# Patient Record
Sex: Male | Born: 1935 | Race: White | Hispanic: No | Marital: Married | Smoking: Former smoker
Health system: Southern US, Community
[De-identification: ages and names within clinical notes are randomized; demographics above are authoritative.]

## PROBLEM LIST (undated history)

## (undated) DIAGNOSIS — E7800 Pure hypercholesterolemia, unspecified: Secondary | ICD-10-CM

## (undated) DIAGNOSIS — M1990 Unspecified osteoarthritis, unspecified site: Secondary | ICD-10-CM

## (undated) DIAGNOSIS — I10 Essential (primary) hypertension: Secondary | ICD-10-CM

## (undated) DIAGNOSIS — H9190 Unspecified hearing loss, unspecified ear: Secondary | ICD-10-CM

## (undated) HISTORY — PX: TONSILLECTOMY: SUR1361

## (undated) HISTORY — PX: HERNIA REPAIR: SHX51

---

## 2013-05-07 HISTORY — PX: TRANSURETHRAL RESECTION OF PROSTATE: SHX73

## 2015-03-24 DIAGNOSIS — R413 Other amnesia: Secondary | ICD-10-CM | POA: Diagnosis not present

## 2015-03-24 DIAGNOSIS — N401 Enlarged prostate with lower urinary tract symptoms: Secondary | ICD-10-CM | POA: Diagnosis not present

## 2015-03-24 DIAGNOSIS — I10 Essential (primary) hypertension: Secondary | ICD-10-CM | POA: Diagnosis not present

## 2015-03-24 DIAGNOSIS — R202 Paresthesia of skin: Secondary | ICD-10-CM | POA: Diagnosis not present

## 2015-03-24 DIAGNOSIS — E7800 Pure hypercholesterolemia, unspecified: Secondary | ICD-10-CM | POA: Diagnosis not present

## 2015-03-29 DIAGNOSIS — N401 Enlarged prostate with lower urinary tract symptoms: Secondary | ICD-10-CM | POA: Diagnosis not present

## 2015-03-29 DIAGNOSIS — R3912 Poor urinary stream: Secondary | ICD-10-CM | POA: Diagnosis not present

## 2015-03-29 DIAGNOSIS — N393 Stress incontinence (female) (male): Secondary | ICD-10-CM | POA: Diagnosis not present

## 2015-06-10 DIAGNOSIS — X32XXXA Exposure to sunlight, initial encounter: Secondary | ICD-10-CM | POA: Diagnosis not present

## 2015-06-10 DIAGNOSIS — D1801 Hemangioma of skin and subcutaneous tissue: Secondary | ICD-10-CM | POA: Diagnosis not present

## 2015-06-10 DIAGNOSIS — L570 Actinic keratosis: Secondary | ICD-10-CM | POA: Diagnosis not present

## 2015-06-10 DIAGNOSIS — D235 Other benign neoplasm of skin of trunk: Secondary | ICD-10-CM | POA: Diagnosis not present

## 2015-07-06 DIAGNOSIS — M545 Low back pain: Secondary | ICD-10-CM | POA: Diagnosis not present

## 2015-09-21 DIAGNOSIS — R413 Other amnesia: Secondary | ICD-10-CM | POA: Diagnosis not present

## 2015-09-21 DIAGNOSIS — E7800 Pure hypercholesterolemia, unspecified: Secondary | ICD-10-CM | POA: Diagnosis not present

## 2015-09-21 DIAGNOSIS — R202 Paresthesia of skin: Secondary | ICD-10-CM | POA: Diagnosis not present

## 2015-09-21 DIAGNOSIS — I10 Essential (primary) hypertension: Secondary | ICD-10-CM | POA: Diagnosis not present

## 2015-09-21 DIAGNOSIS — N401 Enlarged prostate with lower urinary tract symptoms: Secondary | ICD-10-CM | POA: Diagnosis not present

## 2015-12-06 DIAGNOSIS — H2513 Age-related nuclear cataract, bilateral: Secondary | ICD-10-CM | POA: Diagnosis not present

## 2015-12-09 DIAGNOSIS — D1801 Hemangioma of skin and subcutaneous tissue: Secondary | ICD-10-CM | POA: Diagnosis not present

## 2015-12-09 DIAGNOSIS — X32XXXA Exposure to sunlight, initial encounter: Secondary | ICD-10-CM | POA: Diagnosis not present

## 2015-12-09 DIAGNOSIS — L570 Actinic keratosis: Secondary | ICD-10-CM | POA: Diagnosis not present

## 2016-04-12 DIAGNOSIS — N401 Enlarged prostate with lower urinary tract symptoms: Secondary | ICD-10-CM | POA: Diagnosis not present

## 2016-04-12 DIAGNOSIS — R3912 Poor urinary stream: Secondary | ICD-10-CM | POA: Diagnosis not present

## 2016-04-12 DIAGNOSIS — N393 Stress incontinence (female) (male): Secondary | ICD-10-CM | POA: Diagnosis not present

## 2016-05-02 DIAGNOSIS — E7800 Pure hypercholesterolemia, unspecified: Secondary | ICD-10-CM | POA: Diagnosis not present

## 2016-05-02 DIAGNOSIS — N401 Enlarged prostate with lower urinary tract symptoms: Secondary | ICD-10-CM | POA: Diagnosis not present

## 2016-05-02 DIAGNOSIS — I10 Essential (primary) hypertension: Secondary | ICD-10-CM | POA: Diagnosis not present

## 2016-05-02 DIAGNOSIS — M129 Arthropathy, unspecified: Secondary | ICD-10-CM | POA: Diagnosis not present

## 2016-11-01 DIAGNOSIS — N401 Enlarged prostate with lower urinary tract symptoms: Secondary | ICD-10-CM | POA: Diagnosis not present

## 2016-11-01 DIAGNOSIS — M129 Arthropathy, unspecified: Secondary | ICD-10-CM | POA: Diagnosis not present

## 2016-11-01 DIAGNOSIS — E7800 Pure hypercholesterolemia, unspecified: Secondary | ICD-10-CM | POA: Diagnosis not present

## 2016-11-01 DIAGNOSIS — I10 Essential (primary) hypertension: Secondary | ICD-10-CM | POA: Diagnosis not present

## 2016-11-20 DIAGNOSIS — E669 Obesity, unspecified: Secondary | ICD-10-CM | POA: Diagnosis not present

## 2016-11-20 DIAGNOSIS — M5440 Lumbago with sciatica, unspecified side: Secondary | ICD-10-CM | POA: Diagnosis not present

## 2016-12-07 DIAGNOSIS — L821 Other seborrheic keratosis: Secondary | ICD-10-CM | POA: Diagnosis not present

## 2016-12-07 DIAGNOSIS — L218 Other seborrheic dermatitis: Secondary | ICD-10-CM | POA: Diagnosis not present

## 2016-12-07 DIAGNOSIS — X32XXXA Exposure to sunlight, initial encounter: Secondary | ICD-10-CM | POA: Diagnosis not present

## 2016-12-07 DIAGNOSIS — L570 Actinic keratosis: Secondary | ICD-10-CM | POA: Diagnosis not present

## 2017-04-18 DIAGNOSIS — N401 Enlarged prostate with lower urinary tract symptoms: Secondary | ICD-10-CM | POA: Diagnosis not present

## 2017-04-18 DIAGNOSIS — N393 Stress incontinence (female) (male): Secondary | ICD-10-CM | POA: Diagnosis not present

## 2017-04-18 DIAGNOSIS — R3912 Poor urinary stream: Secondary | ICD-10-CM | POA: Diagnosis not present

## 2017-05-07 DIAGNOSIS — Z23 Encounter for immunization: Secondary | ICD-10-CM | POA: Diagnosis not present

## 2017-05-07 DIAGNOSIS — N401 Enlarged prostate with lower urinary tract symptoms: Secondary | ICD-10-CM | POA: Diagnosis not present

## 2017-05-07 DIAGNOSIS — R413 Other amnesia: Secondary | ICD-10-CM | POA: Diagnosis not present

## 2017-05-07 DIAGNOSIS — M129 Arthropathy, unspecified: Secondary | ICD-10-CM | POA: Diagnosis not present

## 2017-05-07 DIAGNOSIS — I10 Essential (primary) hypertension: Secondary | ICD-10-CM | POA: Diagnosis not present

## 2017-05-07 DIAGNOSIS — E7800 Pure hypercholesterolemia, unspecified: Secondary | ICD-10-CM | POA: Diagnosis not present

## 2017-05-07 DIAGNOSIS — Z0000 Encounter for general adult medical examination without abnormal findings: Secondary | ICD-10-CM | POA: Diagnosis not present

## 2017-11-20 DIAGNOSIS — E7800 Pure hypercholesterolemia, unspecified: Secondary | ICD-10-CM | POA: Diagnosis not present

## 2017-11-20 DIAGNOSIS — N401 Enlarged prostate with lower urinary tract symptoms: Secondary | ICD-10-CM | POA: Diagnosis not present

## 2017-11-20 DIAGNOSIS — I10 Essential (primary) hypertension: Secondary | ICD-10-CM | POA: Diagnosis not present

## 2017-11-20 DIAGNOSIS — M129 Arthropathy, unspecified: Secondary | ICD-10-CM | POA: Diagnosis not present

## 2017-11-20 DIAGNOSIS — R413 Other amnesia: Secondary | ICD-10-CM | POA: Diagnosis not present

## 2017-11-22 DIAGNOSIS — E7800 Pure hypercholesterolemia, unspecified: Secondary | ICD-10-CM | POA: Diagnosis not present

## 2018-01-01 DIAGNOSIS — R413 Other amnesia: Secondary | ICD-10-CM | POA: Diagnosis not present

## 2018-02-10 DIAGNOSIS — H35372 Puckering of macula, left eye: Secondary | ICD-10-CM | POA: Diagnosis not present

## 2018-03-24 DIAGNOSIS — H353132 Nonexudative age-related macular degeneration, bilateral, intermediate dry stage: Secondary | ICD-10-CM | POA: Diagnosis not present

## 2018-03-24 DIAGNOSIS — H348122 Central retinal vein occlusion, left eye, stable: Secondary | ICD-10-CM | POA: Diagnosis not present

## 2018-04-10 DIAGNOSIS — F320 Major depressive disorder, single episode, mild: Secondary | ICD-10-CM | POA: Diagnosis not present

## 2018-04-22 DIAGNOSIS — N393 Stress incontinence (female) (male): Secondary | ICD-10-CM | POA: Diagnosis not present

## 2018-04-22 DIAGNOSIS — N401 Enlarged prostate with lower urinary tract symptoms: Secondary | ICD-10-CM | POA: Diagnosis not present

## 2018-04-22 DIAGNOSIS — R3912 Poor urinary stream: Secondary | ICD-10-CM | POA: Diagnosis not present

## 2018-05-15 DIAGNOSIS — H2513 Age-related nuclear cataract, bilateral: Secondary | ICD-10-CM | POA: Diagnosis not present

## 2018-05-15 DIAGNOSIS — H353132 Nonexudative age-related macular degeneration, bilateral, intermediate dry stage: Secondary | ICD-10-CM | POA: Diagnosis not present

## 2018-06-05 DIAGNOSIS — I10 Essential (primary) hypertension: Secondary | ICD-10-CM | POA: Diagnosis not present

## 2018-06-05 DIAGNOSIS — R5383 Other fatigue: Secondary | ICD-10-CM | POA: Diagnosis not present

## 2018-06-05 DIAGNOSIS — R42 Dizziness and giddiness: Secondary | ICD-10-CM | POA: Diagnosis not present

## 2018-06-17 ENCOUNTER — Ambulatory Visit: Admission: RE | Admit: 2018-06-17 | Payer: PPO | Source: Home / Self Care | Admitting: Ophthalmology

## 2018-06-17 ENCOUNTER — Encounter: Admission: RE | Payer: Self-pay | Source: Home / Self Care

## 2018-06-17 SURGERY — PHACOEMULSIFICATION, CATARACT, WITH IOL INSERTION
Anesthesia: Topical | Laterality: Right

## 2018-07-29 ENCOUNTER — Other Ambulatory Visit: Payer: Self-pay

## 2018-07-29 ENCOUNTER — Encounter: Payer: Self-pay | Admitting: *Deleted

## 2018-07-31 DIAGNOSIS — E1159 Type 2 diabetes mellitus with other circulatory complications: Secondary | ICD-10-CM | POA: Diagnosis not present

## 2018-07-31 DIAGNOSIS — H2511 Age-related nuclear cataract, right eye: Secondary | ICD-10-CM | POA: Diagnosis not present

## 2018-07-31 NOTE — Discharge Instructions (Signed)

## 2018-08-01 ENCOUNTER — Other Ambulatory Visit: Payer: Self-pay

## 2018-08-01 ENCOUNTER — Encounter
Admission: RE | Admit: 2018-08-01 | Discharge: 2018-08-01 | Disposition: A | Discharge status: Home / Self Care | Payer: PPO | Source: Ambulatory Visit | Attending: Ophthalmology | Admitting: Ophthalmology

## 2018-08-01 DIAGNOSIS — Z1159 Encounter for screening for other viral diseases: Secondary | ICD-10-CM | POA: Diagnosis not present

## 2018-08-01 DIAGNOSIS — Z01812 Encounter for preprocedural laboratory examination: Secondary | ICD-10-CM | POA: Insufficient documentation

## 2018-08-02 LAB — NOVEL CORONAVIRUS, NAA (HOSP ORDER, SEND-OUT TO REF LAB; TAT 18-24 HRS): SARS-CoV-2, NAA: NOT DETECTED

## 2018-08-06 ENCOUNTER — Encounter
Admission: RE | Disposition: A | Discharge status: Home / Self Care | Payer: Self-pay | Source: Home / Self Care | Attending: Ophthalmology

## 2018-08-06 ENCOUNTER — Ambulatory Visit: Payer: PPO | Admitting: Anesthesiology

## 2018-08-06 ENCOUNTER — Ambulatory Visit
Admission: RE | Admit: 2018-08-06 | Discharge: 2018-08-06 | Disposition: A | Discharge status: Home / Self Care | Payer: PPO | Attending: Ophthalmology | Admitting: Ophthalmology

## 2018-08-06 ENCOUNTER — Other Ambulatory Visit: Payer: Self-pay

## 2018-08-06 DIAGNOSIS — E7800 Pure hypercholesterolemia, unspecified: Secondary | ICD-10-CM | POA: Insufficient documentation

## 2018-08-06 DIAGNOSIS — H2511 Age-related nuclear cataract, right eye: Secondary | ICD-10-CM | POA: Insufficient documentation

## 2018-08-06 DIAGNOSIS — H9190 Unspecified hearing loss, unspecified ear: Secondary | ICD-10-CM | POA: Diagnosis not present

## 2018-08-06 DIAGNOSIS — Z85828 Personal history of other malignant neoplasm of skin: Secondary | ICD-10-CM | POA: Insufficient documentation

## 2018-08-06 DIAGNOSIS — Z79899 Other long term (current) drug therapy: Secondary | ICD-10-CM | POA: Insufficient documentation

## 2018-08-06 DIAGNOSIS — M1990 Unspecified osteoarthritis, unspecified site: Secondary | ICD-10-CM | POA: Insufficient documentation

## 2018-08-06 DIAGNOSIS — I10 Essential (primary) hypertension: Secondary | ICD-10-CM | POA: Diagnosis not present

## 2018-08-06 DIAGNOSIS — F329 Major depressive disorder, single episode, unspecified: Secondary | ICD-10-CM | POA: Insufficient documentation

## 2018-08-06 DIAGNOSIS — Z882 Allergy status to sulfonamides status: Secondary | ICD-10-CM | POA: Diagnosis not present

## 2018-08-06 DIAGNOSIS — R609 Edema, unspecified: Secondary | ICD-10-CM | POA: Insufficient documentation

## 2018-08-06 DIAGNOSIS — H25811 Combined forms of age-related cataract, right eye: Secondary | ICD-10-CM | POA: Diagnosis not present

## 2018-08-06 HISTORY — DX: Essential (primary) hypertension: I10

## 2018-08-06 HISTORY — PX: CATARACT EXTRACTION W/PHACO: SHX586

## 2018-08-06 HISTORY — DX: Pure hypercholesterolemia, unspecified: E78.00

## 2018-08-06 HISTORY — DX: Unspecified hearing loss, unspecified ear: H91.90

## 2018-08-06 HISTORY — DX: Unspecified osteoarthritis, unspecified site: M19.90

## 2018-08-06 SURGERY — PHACOEMULSIFICATION, CATARACT, WITH IOL INSERTION
Anesthesia: Monitor Anesthesia Care | Site: Eye | Laterality: Right

## 2018-08-06 MED ORDER — NA HYALUR & NA CHOND-NA HYALUR 0.4-0.35 ML IO KIT
PACK | INTRAOCULAR | Status: DC | PRN
  Administered 2018-08-06: 1 mL via INTRAOCULAR

## 2018-08-06 MED ORDER — ARMC OPHTHALMIC DILATING DROPS
1.0000 "application " | OPHTHALMIC | Status: DC | PRN
  Administered 2018-08-06 (×3): 1 via OPHTHALMIC

## 2018-08-06 MED ORDER — MIDAZOLAM HCL 2 MG/2ML IJ SOLN
INTRAMUSCULAR | Status: DC | PRN
  Administered 2018-08-06: 1.5 mg via INTRAVENOUS

## 2018-08-06 MED ORDER — TETRACAINE HCL 0.5 % OP SOLN
1.0000 [drp] | OPHTHALMIC | Status: DC | PRN
  Administered 2018-08-06 (×3): 1 [drp] via OPHTHALMIC

## 2018-08-06 MED ORDER — FENTANYL CITRATE (PF) 100 MCG/2ML IJ SOLN
INTRAMUSCULAR | Status: DC | PRN
  Administered 2018-08-06: 50 ug via INTRAVENOUS

## 2018-08-06 MED ORDER — OXYCODONE HCL 5 MG/5ML PO SOLN: 5.0000 mg | Freq: Once | ORAL | Status: DC | PRN

## 2018-08-06 MED ORDER — CEFUROXIME OPHTHALMIC INJECTION 1 MG/0.1 ML
INJECTION | OPHTHALMIC | Status: DC | PRN
  Administered 2018-08-06: 0.1 mL via INTRACAMERAL

## 2018-08-06 MED ORDER — OXYCODONE HCL 5 MG PO TABS: 5.0000 mg | ORAL_TABLET | Freq: Once | ORAL | Status: DC | PRN

## 2018-08-06 MED ORDER — MOXIFLOXACIN HCL 0.5 % OP SOLN
1.0000 [drp] | OPHTHALMIC | Status: DC | PRN
  Administered 2018-08-06 (×3): 1 [drp] via OPHTHALMIC

## 2018-08-06 MED ORDER — LACTATED RINGERS IV SOLN: INTRAVENOUS | Status: DC

## 2018-08-06 MED ORDER — BRIMONIDINE TARTRATE-TIMOLOL 0.2-0.5 % OP SOLN
OPHTHALMIC | Status: DC | PRN
  Administered 2018-08-06: 1 [drp] via OPHTHALMIC

## 2018-08-06 MED ORDER — EPINEPHRINE PF 1 MG/ML IJ SOLN
INTRAOCULAR | Status: DC | PRN
  Administered 2018-08-06: 78 mL via OPHTHALMIC

## 2018-08-06 MED ORDER — LIDOCAINE HCL (PF) 2 % IJ SOLN
INTRAOCULAR | Status: DC | PRN
  Administered 2018-08-06: 1 mL

## 2018-08-06 SURGICAL SUPPLY — 20 items
CANNULA ANT/CHMB 27G (MISCELLANEOUS) ×1 IMPLANT
CANNULA ANT/CHMB 27GA (MISCELLANEOUS) ×3 IMPLANT
GLOVE SURG LX 7.5 STRW (GLOVE) ×2
GLOVE SURG LX STRL 7.5 STRW (GLOVE) ×1 IMPLANT
GLOVE SURG TRIUMPH 8.0 PF LTX (GLOVE) ×3 IMPLANT
GOWN STRL REUS W/ TWL LRG LVL3 (GOWN DISPOSABLE) ×2 IMPLANT
GOWN STRL REUS W/TWL LRG LVL3 (GOWN DISPOSABLE) ×4
LENS IOL TECNIS ITEC 19.5 (Intraocular Lens) ×2 IMPLANT
MARKER SKIN DUAL TIP RULER LAB (MISCELLANEOUS) ×3 IMPLANT
NDL FILTER BLUNT 18X1 1/2 (NEEDLE) ×1 IMPLANT
NEEDLE FILTER BLUNT 18X 1/2SAF (NEEDLE) ×2
NEEDLE FILTER BLUNT 18X1 1/2 (NEEDLE) ×1 IMPLANT
PACK CATARACT BRASINGTON (MISCELLANEOUS) ×3 IMPLANT
PACK EYE AFTER SURG (MISCELLANEOUS) ×3 IMPLANT
PACK OPTHALMIC (MISCELLANEOUS) ×3 IMPLANT
SYR 3ML LL SCALE MARK (SYRINGE) ×3 IMPLANT
SYR 5ML LL (SYRINGE) ×3 IMPLANT
SYR TB 1ML LUER SLIP (SYRINGE) ×3 IMPLANT
WATER STERILE IRR 500ML POUR (IV SOLUTION) ×3 IMPLANT
WIPE NON LINTING 3.25X3.25 (MISCELLANEOUS) ×3 IMPLANT

## 2018-08-06 NOTE — H&P (Signed)

## 2018-08-06 NOTE — Anesthesia Procedure Notes (Signed)
Procedure Name: MAC Performed by: Giannah Zavadil, CRNA Pre-anesthesia Checklist: Patient identified, Emergency Drugs available, Suction available, Timeout performed and Patient being monitored Patient Re-evaluated:Patient Re-evaluated prior to induction Oxygen Delivery Method: Nasal cannula Placement Confirmation: positive ETCO2       

## 2018-08-06 NOTE — Anesthesia Postprocedure Evaluation (Signed)
Anesthesia Post Note  Patient: Randall Schneider  Procedure(s) Performed: CATARACT EXTRACTION PHACO AND INTRAOCULAR LENS PLACEMENT (IOC) RIGHT DIABETES (Right Eye)  Patient location during evaluation: PACU Anesthesia Type: MAC Level of consciousness: awake and alert Pain management: pain level controlled Vital Signs Assessment: post-procedure vital signs reviewed and stable Respiratory status: spontaneous breathing Cardiovascular status: stable Anesthetic complications: no    Jaci Standard, III,  Eldean Klatt D

## 2018-08-06 NOTE — Op Note (Signed)
LOCATION:  [place]   PREOPERATIVE DIAGNOSIS:    Nuclear sclerotic cataract right eye. H25.11   POSTOPERATIVE DIAGNOSIS:  Nuclear sclerotic cataract right eye.     PROCEDURE:  Phacoemusification with posterior chamber intraocular lens placement of the right eye   LENS:   Implant Name Type Inv. Item Serial No. Manufacturer Lot No. LRB No. Used  LENS IOL DIOP 19.5 - [id number] Intraocular Lens LENS IOL DIOP 19.5 [id number]  Right 1        ULTRASOUND TIME: 14 % of 1 minutes, 1 seconds.  CDE 8.7   SURGEON:  Wyonia Hough, MD   ANESTHESIA:  Topical with tetracaine drops and 2% Xylocaine jelly, augmented with 1% preservative-free intracameral lidocaine.    COMPLICATIONS:  None.   DESCRIPTION OF PROCEDURE:  The patient was identified in the holding room and transported to the operating room and placed in the supine position under the operating microscope.  The right eye was identified as the operative eye and it was prepped and draped in the usual sterile ophthalmic fashion.   A 1 millimeter clear-corneal paracentesis was made at the 12:00 position.  0.5 ml of preservative-free 1% lidocaine was injected into the anterior chamber. The anterior chamber was filled with Viscoat viscoelastic.  A 2.4 millimeter keratome was used to make a near-clear corneal incision at the 9:00 position.  A curvilinear capsulorrhexis was made with a cystotome and capsulorrhexis forceps.  Balanced salt solution was used to hydrodissect and hydrodelineate the nucleus.   Phacoemulsification was then used in stop and chop fashion to remove the lens nucleus and epinucleus.  The remaining cortex was then removed using the irrigation and aspiration handpiece. Provisc was then placed into the capsular bag to distend it for lens placement.  A lens was then injected into the capsular bag.  The remaining viscoelastic was aspirated.   Wounds were hydrated with balanced salt solution.  The anterior  chamber was inflated to a physiologic pressure with balanced salt solution.  No wound leaks were noted. Cefuroxime 0.1 ml of a 10mg /ml solution was injected into the anterior chamber for a dose of 1 mg of intracameral antibiotic at the completion of the case.   Timolol and Brimonidine drops were applied to the eye.  The patient was taken to the recovery room in stable condition without complications of anesthesia or surgery.   Santoria Chason 08/06/2018, 8:27 AM

## 2018-08-06 NOTE — Anesthesia Preprocedure Evaluation (Signed)
Anesthesia Evaluation  Patient identified by MRN, date of birth, ID band Patient awake    Reviewed: Allergy & Precautions, H&P , NPO status , Patient's Chart, lab work & pertinent test results  Airway Mallampati: II  TM Distance: >3 FB Neck ROM: full    Dental  (+) Poor Dentition   Pulmonary former smoker,    Pulmonary exam normal breath sounds clear to auscultation       Cardiovascular hypertension, On Medications Normal cardiovascular exam Rhythm:regular Rate:Normal     Neuro/Psych negative neurological ROS  negative psych ROS   GI/Hepatic negative GI ROS, Neg liver ROS,   Endo/Other  negative endocrine ROS  Renal/GU negative Renal ROS  negative genitourinary   Musculoskeletal   Abdominal   Peds  Hematology negative hematology ROS (+)   Anesthesia Other Findings   Reproductive/Obstetrics negative OB ROS                             Anesthesia Physical Anesthesia Plan  ASA: II  Anesthesia Plan: MAC   Post-op Pain Management:    Induction:   PONV Risk Score and Plan:   Airway Management Planned:   Additional Equipment:   Intra-op Plan:   Post-operative Plan:   Informed Consent: I have reviewed the patients History and Physical, chart, labs and discussed the procedure including the risks, benefits and alternatives for the proposed anesthesia with the patient or authorized representative who has indicated his/her understanding and acceptance.       Plan Discussed with:   Anesthesia Plan Comments:         Anesthesia Quick Evaluation

## 2018-08-06 NOTE — Transfer of Care (Signed)
Immediate Anesthesia Transfer of Care Note  Patient: Randall Schneider  Procedure(s) Performed: CATARACT EXTRACTION PHACO AND INTRAOCULAR LENS PLACEMENT (IOC) RIGHT DIABETES (Right Eye)  Patient Location: PACU  Anesthesia Type: MAC  Level of Consciousness: awake, alert  and patient cooperative  Airway and Oxygen Therapy: Patient Spontanous Breathing and Patient connected to supplemental oxygen  Post-op Assessment: Post-op Vital signs reviewed, Patient's Cardiovascular Status Stable, Respiratory Function Stable, Patent Airway and No signs of Nausea or vomiting  Post-op Vital Signs: Reviewed and stable  Complications: No apparent anesthesia complications

## 2018-08-07 ENCOUNTER — Encounter: Payer: Self-pay | Admitting: Ophthalmology

## 2018-08-21 DIAGNOSIS — M79674 Pain in right toe(s): Secondary | ICD-10-CM | POA: Diagnosis not present

## 2018-08-21 DIAGNOSIS — M79675 Pain in left toe(s): Secondary | ICD-10-CM | POA: Diagnosis not present

## 2018-08-21 DIAGNOSIS — B351 Tinea unguium: Secondary | ICD-10-CM | POA: Diagnosis not present

## 2018-09-15 DIAGNOSIS — H35372 Puckering of macula, left eye: Secondary | ICD-10-CM | POA: Diagnosis not present

## 2018-10-13 DIAGNOSIS — I10 Essential (primary) hypertension: Secondary | ICD-10-CM | POA: Diagnosis not present

## 2018-10-13 DIAGNOSIS — F320 Major depressive disorder, single episode, mild: Secondary | ICD-10-CM | POA: Diagnosis not present

## 2018-10-13 DIAGNOSIS — N401 Enlarged prostate with lower urinary tract symptoms: Secondary | ICD-10-CM | POA: Diagnosis not present

## 2018-10-13 DIAGNOSIS — E7800 Pure hypercholesterolemia, unspecified: Secondary | ICD-10-CM | POA: Diagnosis not present

## 2018-10-13 DIAGNOSIS — M129 Arthropathy, unspecified: Secondary | ICD-10-CM | POA: Diagnosis not present

## 2018-10-13 DIAGNOSIS — Z0000 Encounter for general adult medical examination without abnormal findings: Secondary | ICD-10-CM | POA: Diagnosis not present

## 2018-10-13 DIAGNOSIS — R413 Other amnesia: Secondary | ICD-10-CM | POA: Diagnosis not present

## 2018-11-26 DIAGNOSIS — R413 Other amnesia: Secondary | ICD-10-CM | POA: Diagnosis not present

## 2018-12-08 ENCOUNTER — Ambulatory Visit: Payer: PPO | Admitting: Physical Therapy

## 2018-12-29 DIAGNOSIS — E860 Dehydration: Secondary | ICD-10-CM | POA: Diagnosis not present

## 2018-12-29 DIAGNOSIS — Z23 Encounter for immunization: Secondary | ICD-10-CM | POA: Diagnosis not present

## 2018-12-29 DIAGNOSIS — N401 Enlarged prostate with lower urinary tract symptoms: Secondary | ICD-10-CM | POA: Diagnosis not present

## 2018-12-29 DIAGNOSIS — R3989 Other symptoms and signs involving the genitourinary system: Secondary | ICD-10-CM | POA: Diagnosis not present

## 2018-12-29 DIAGNOSIS — R5383 Other fatigue: Secondary | ICD-10-CM | POA: Diagnosis not present

## 2019-01-13 DIAGNOSIS — D649 Anemia, unspecified: Secondary | ICD-10-CM | POA: Diagnosis not present

## 2019-03-18 DIAGNOSIS — R413 Other amnesia: Secondary | ICD-10-CM | POA: Diagnosis not present

## 2019-04-15 DIAGNOSIS — Z0000 Encounter for general adult medical examination without abnormal findings: Secondary | ICD-10-CM | POA: Diagnosis not present

## 2019-04-15 DIAGNOSIS — F320 Major depressive disorder, single episode, mild: Secondary | ICD-10-CM | POA: Diagnosis not present

## 2019-04-15 DIAGNOSIS — M129 Arthropathy, unspecified: Secondary | ICD-10-CM | POA: Diagnosis not present

## 2019-04-15 DIAGNOSIS — I10 Essential (primary) hypertension: Secondary | ICD-10-CM | POA: Diagnosis not present

## 2019-04-15 DIAGNOSIS — E7800 Pure hypercholesterolemia, unspecified: Secondary | ICD-10-CM | POA: Diagnosis not present

## 2019-04-15 DIAGNOSIS — R413 Other amnesia: Secondary | ICD-10-CM | POA: Diagnosis not present

## 2019-04-27 DIAGNOSIS — H1031 Unspecified acute conjunctivitis, right eye: Secondary | ICD-10-CM | POA: Diagnosis not present

## 2019-05-27 ENCOUNTER — Ambulatory Visit: Payer: PPO | Attending: Acute Care | Admitting: Physical Therapy

## 2019-05-27 ENCOUNTER — Other Ambulatory Visit: Payer: Self-pay

## 2019-05-27 DIAGNOSIS — R293 Abnormal posture: Secondary | ICD-10-CM | POA: Diagnosis not present

## 2019-05-27 DIAGNOSIS — R262 Difficulty in walking, not elsewhere classified: Secondary | ICD-10-CM

## 2019-05-27 DIAGNOSIS — M6281 Muscle weakness (generalized): Secondary | ICD-10-CM

## 2019-05-27 NOTE — Patient Instructions (Signed)
Access Code: [email or web address]: [email or web address]: 03/17/2021Prepared by: Haskell Flirt  Standing March with Counter Support - 1 x daily - 7 x weekly - 1 sets - 20 reps  Standing Hip Abduction with Counter Support - 1 x daily - 7 x weekly - 1 sets - 20 reps  Standing Hip Extension with Counter Support - 1 x daily - 7 x weekly - 1 sets - 20 reps  Heel rises with counter support - 1 x daily - 7 x weekly - 1 sets - 20 reps  Mini Squat with Counter Support - 1 x daily - 7 x weekly - 1 sets - 20 reps

## 2019-05-30 ENCOUNTER — Encounter: Payer: Self-pay | Admitting: Physical Therapy

## 2019-05-31 NOTE — Therapy (Signed)
[facility] [address] [place], [place], [address] Phone: [phone]   Fax:  [phone]  Physical Therapy Evaluation  Patient Details  Name: Randall Schneider MRN: [id number] Date of Birth: [birth year] Referring Provider (PT): Chipper Herb, NP   Encounter Date: 05/27/2019  PT End of Session - 05/31/19 0854    Visit Number  1    Number of Visits  9    Date for PT Re-Evaluation  06/24/19    Authorization - Visit Number  1    Authorization - Number of Visits  10    PT Start Time  1301    PT Stop Time  1358    PT Time Calculation (min)  57 min    Equipment Utilized During Treatment  Gait belt    Activity Tolerance  Patient tolerated treatment well    Behavior During Therapy  [facility] for tasks assessed/performed       Past Medical History:  Diagnosis Date  . Arthritis   . HOH (hard of hearing)   . Hypercholesteremia   . Hypertension     Past Surgical History:  Procedure Laterality Date  . CATARACT EXTRACTION W/PHACO Right 08/06/2018   Procedure: CATARACT EXTRACTION PHACO AND INTRAOCULAR LENS PLACEMENT ([place]) RIGHT DIABETES;  Surgeon: Leandrew Koyanagi, MD;  Location: [place];  Service: Ophthalmology;  Laterality: Right;  . CIRCUMCISION, NON-NEWBORN  05/07/2013  . HERNIA REPAIR    . TONSILLECTOMY     and adenoids  . TRANSURETHRAL RESECTION OF PROSTATE  05/07/2013    There were no vitals filed for this visit.   Subjective Assessment - 05/31/19 0848    Subjective  Pts. wife reports pain is very inactive at home and does not do much.  Pt. ambulates with use of RW (PT properly fitted) and shuffling gait pattern.  Pt. reports no pain at this time and h/o falls.    Pertinent History  Pt. limited with information on PMHx and pts. wife had to assist.  Pt. was confused during evaluation.    Limitations  Lifting;House hold activities;Standing;Walking    Patient Stated Goals  Increase activity/ improve walking/ decrease  fall risk    Currently in Pain?  No/denies         [facility] PT Assessment - 05/31/19 0001      Assessment   Medical Diagnosis  Unsteady Gait    Referring Provider (PT)  Chipper Herb, NP    Onset Date/Surgical Date  03/12/18    Prior Therapy  No      Precautions   Precautions  Fall      Balance Screen   Has the patient fallen in the past 6 months  Yes    Has the patient had a decrease in activity level because of a fear of falling?   Yes      Prior Function   Level of Independence  Requires assistive device for independence    Vocation  Retired      Associate Professor   Overall Cognitive Status  History of cognitive impairments - at baseline    Memory  Impaired          Objective measurements completed on examination: See above findings.     See HEP Nustep L3 10 min. B UE/LE   PT Education - 05/31/19 0853    Education Details  See HEP    Person(s) Educated  Patient    Methods  Explanation;Demonstration;Handout    Comprehension  Verbalized understanding;Returned demonstration  PT Long Term Goals - 05/31/19 0906      PT LONG TERM GOAL #1   Title  Pt. will be independent with HEP to increase B hip strength 1/2 muscle grade to improve standing/walking.    Baseline  B LE AROM WFL. B LE muscle strength grossly 5/5 MMT except hip flexion/ abduction 4/5 MMT.    Time  4    Period  Weeks    Status  New    Target Date  06/24/19      PT LONG TERM GOAL #2   Title  Pt. will increase Berg balance test to >44/56 to improve walking/ decrease fall risk.    Baseline  Berg: 37/56 (significant fall risk).    Time  4    Period  Weeks    Status  New    Target Date  06/24/19      PT LONG TERM GOAL #3   Title  Pt. will demonstrate a consistent hip flexion/ step pattern with use of RW to decrease fall risk/ improve mobility.    Baseline  Moderate shuffling gait pattern.  Limited hip flexion/ step length with heavy UE assist on RW.    Time  4    Period  Weeks    Status   New    Target Date  06/24/19      PT LONG TERM GOAL #4   Title  Pt. will be able to complete 30 minutes of standing/ ther.ex. with no loss of balance/ mod. independence safely.    Baseline  Pt. currently not exercising/ very inactive at home.    Time  4    Period  Weeks    Status  New    Target Date  06/24/19             Plan - 05/31/19 0854    Clinical Impression Statement  Pt. is a pleasant 86 y/o male who c/o difficulty walking and pts. wife reports pt. is very inactive at home.  Pt. has difficulty answering questions about PMHx and goals for PT.  Pt. reports no pain during PT evaluation and pts. wife states she wants to travel this summer to [place] but concerned about pts. walking/ fall risk.  B LE AROM WFL.  B LE muscle strength grossly 5/5 MMT except hip flexion/ abduction 4/5 MMT.  Pt. ambulates with shuffling gait pattern, round shoulder/ forward posture and limited hip flexion/ heel strike with use of RW.  Berg balance test: 37/56 (significant fall risk).  Pt. unable to properly answer questions on FOTO.  Pt. will benefit from skilled PT services to increaes LE strengthening to improve posture/ gait with least assistive device to improve safety/ mod. independence.    Stability/Clinical Decision Making  Unstable/Unpredictable    Clinical Decision Making  High    Rehab Potential  Fair    PT Frequency  2x / week    PT Duration  4 weeks    PT Treatment/Interventions  ADLs/Self Care Home Management;DME Instruction;Therapeutic activities;Functional mobility training;Stair training;Gait training;Therapeutic exercise;Balance training;Neuromuscular re-education;Cognitive remediation;Patient/family education;Manual techniques;Energy conservation;Passive range of motion    PT Next Visit Plan  Reassess HEP/ Gait training/ balance activities    PT Home Exercise Plan  [id number]    Consulted and Agree with Plan of Care  Patient;Family member/caregiver       Patient will benefit from  skilled therapeutic intervention in order to improve the following deficits and impairments:  Abnormal gait, Decreased balance, Decreased endurance, Decreased mobility, Difficulty walking,  Improper body mechanics, Decreased range of motion, Decreased activity tolerance, Decreased coordination, Decreased knowledge of use of DME, Decreased safety awareness, Decreased strength, Postural dysfunction  Visit Diagnosis: Difficulty in walking, not elsewhere classified  Muscle weakness (generalized)  Abnormal posture     Problem List There are no problems to display for this patient.  Pura Spice, PT, DPT # [phone] 05/31/2019, 9:15 AM  [facility] [address] [place], [place], [address] Phone: [phone]   Fax:  [phone]  Name: Randall Schneider MRN: [id number] Date of Birth: Dec 23, 1930

## 2019-06-03 ENCOUNTER — Other Ambulatory Visit: Payer: Self-pay

## 2019-06-03 ENCOUNTER — Ambulatory Visit: Payer: PPO | Admitting: Physical Therapy

## 2019-06-03 ENCOUNTER — Encounter: Payer: Self-pay | Admitting: Physical Therapy

## 2019-06-03 DIAGNOSIS — R262 Difficulty in walking, not elsewhere classified: Secondary | ICD-10-CM | POA: Diagnosis not present

## 2019-06-03 DIAGNOSIS — M6281 Muscle weakness (generalized): Secondary | ICD-10-CM

## 2019-06-03 DIAGNOSIS — R293 Abnormal posture: Secondary | ICD-10-CM

## 2019-06-03 NOTE — Therapy (Signed)
[facility] [address] [place], [place], [address] Phone: [phone]   Fax:  [phone]  Physical Therapy Treatment  Patient Details  Name: Randall Schneider MRN: [id number] Date of Birth: 1930/10/04 Referring Provider (PT): Chipper Herb, NP   Encounter Date: 06/03/2019  PT End of Session - 06/03/19 0913    Visit Number  2    Number of Visits  9    Date for PT Re-Evaluation  06/24/19    Authorization - Visit Number  2    Authorization - Number of Visits  10    PT Start Time  [id number]    PT Stop Time  0948    PT Time Calculation (min)  54 min    Equipment Utilized During Treatment  Gait belt    Activity Tolerance  Patient tolerated treatment well    Behavior During Therapy  [facility] for tasks assessed/performed       Past Medical History:  Diagnosis Date  . Arthritis   . HOH (hard of hearing)   . Hypercholesteremia   . Hypertension     Past Surgical History:  Procedure Laterality Date  . CATARACT EXTRACTION W/PHACO Right 08/06/2018   Procedure: CATARACT EXTRACTION PHACO AND INTRAOCULAR LENS PLACEMENT ([place]) RIGHT DIABETES;  Surgeon: Leandrew Koyanagi, MD;  Location: [place];  Service: Ophthalmology;  Laterality: Right;  . CIRCUMCISION, NON-NEWBORN  05/07/2013  . HERNIA REPAIR    . TONSILLECTOMY     and adenoids  . TRANSURETHRAL RESECTION OF PROSTATE  05/07/2013    There were no vitals filed for this visit.  Subjective Assessment - 06/03/19 0912    Subjective  Pts. wife reports pt. is doing okay and working on hip flexion/ step pattern.  No falls reported.    Pertinent History  Pt. limited with information on PMHx and pts. wife had to assist.  Pt. was confused during evaluation.    Limitations  Lifting;House hold activities;Standing;Walking    Patient Stated Goals  Increase activity/ improve walking/ decrease fall risk    Currently in Pain?  No/denies        There.ex.:  Nustep L4 10 min. B UE/LE  (discussed activity at home/ use of RW) Seated/standing there.ex. (4#): LAQ/ marching/ lateral walking 20x each.  Standing heel raises 20x. Sit to stands 10x with no UE assist.  (limited L knee extension noted)- no MOI reported.   Discussed HEP  Neuro.mm.:  Walking in //-bars with 1 UE assist to no UE assist working on consistent recip. Pattern with hip flexion/ heel strike.  Pt. Requires CGA for safety and mod. A for verbal cuing to increase BOS/ hip flexion/ step pattern.   Walking in hallway with use of RW 45 feet x 4 with mod. Cuing for proper technique/ RW use.   Walking outside with stepping down curb with RW use.     PT Long Term Goals - 05/31/19 0906      PT LONG TERM GOAL #1   Title  Pt. will be independent with HEP to increase B hip strength 1/2 muscle grade to improve standing/walking.    Baseline  B LE AROM WFL. B LE muscle strength grossly 5/5 MMT except hip flexion/ abduction 4/5 MMT.    Time  4    Period  Weeks    Status  New    Target Date  06/24/19      PT LONG TERM GOAL #2   Title  Pt. will increase Berg balance  test to >44/56 to improve walking/ decrease fall risk.    Baseline  Berg: 37/56 (significant fall risk).    Time  4    Period  Weeks    Status  New    Target Date  06/24/19      PT LONG TERM GOAL #3   Title  Pt. will demonstrate a consistent hip flexion/ step pattern with use of RW to decrease fall risk/ improve mobility.    Baseline  Moderate shuffling gait pattern.  Limited hip flexion/ step length with heavy UE assist on RW.    Time  4    Period  Weeks    Status  New    Target Date  06/24/19      PT LONG TERM GOAL #4   Title  Pt. will be able to complete 30 minutes of standing/ ther.ex. with no loss of balance/ mod. independence safely.    Baseline  Pt. currently not exercising/ very inactive at home.    Time  4    Period  Weeks    Status  New    Target Date  06/24/19            Plan - 06/03/19 1224    Clinical Impression Statement   Pt. entered PT clinic with inconsistent step pattern while using RW.  Pt. requires extra time/ cuing to proper step over door thresholds/ carpet.  Pt. requires moderate cuing t/o tx. session to increase hip flexion/ step pattern.  Pt. demonstrates his ability to ambulate without assistive device in clinic with moderate shuffling pattern/ antalgic gait.  Pt. benefits from use of RW with all aspects of standing/walking in clinic for safety/ decrease fall risk.  Limited L knee extension noted during seated LAQ/ L swing through phase of gait.  Good muscle endurance during Nustep ex./ resisted hip ex. in //-bars.  Pt. benefits from keeping RW closer to body to improve posture and instruction in hip flexion/ step length to decrease shuffling gait.  No change to HEP at this time.    Stability/Clinical Decision Making  Unstable/Unpredictable    Clinical Decision Making  High    Rehab Potential  Fair    PT Frequency  2x / week    PT Duration  4 weeks    PT Treatment/Interventions  ADLs/Self Care Home Management;DME Instruction;Therapeutic activities;Functional mobility training;Stair training;Gait training;Therapeutic exercise;Balance training;Neuromuscular re-education;Cognitive remediation;Patient/family education;Manual techniques;Energy conservation;Passive range of motion    PT Next Visit Plan  Gait training/ balance activities    PT Home Exercise Plan  [id number]    Consulted and Agree with Plan of Care  Patient;Family member/caregiver       Patient will benefit from skilled therapeutic intervention in order to improve the following deficits and impairments:  Abnormal gait, Decreased balance, Decreased endurance, Decreased mobility, Difficulty walking, Improper body mechanics, Decreased range of motion, Decreased activity tolerance, Decreased coordination, Decreased knowledge of use of DME, Decreased safety awareness, Decreased strength, Postural dysfunction  Visit Diagnosis: Difficulty in walking, not  elsewhere classified  Muscle weakness (generalized)  Abnormal posture     Problem List There are no problems to display for this patient.   Pura Spice, PT, DPT # [phone] 06/03/2019, 12:41 PM  [facility] [address] [place], [place], [address] Phone: [phone]   Fax:  [phone]  Name: Randall Schneider MRN: [id number] Date of Birth: 1930-04-26

## 2019-06-08 ENCOUNTER — Ambulatory Visit: Payer: PPO | Admitting: Physical Therapy

## 2019-06-08 ENCOUNTER — Other Ambulatory Visit: Payer: Self-pay

## 2019-06-08 DIAGNOSIS — R262 Difficulty in walking, not elsewhere classified: Secondary | ICD-10-CM

## 2019-06-08 DIAGNOSIS — R293 Abnormal posture: Secondary | ICD-10-CM

## 2019-06-08 DIAGNOSIS — M6281 Muscle weakness (generalized): Secondary | ICD-10-CM

## 2019-06-09 ENCOUNTER — Encounter: Payer: Self-pay | Admitting: Physical Therapy

## 2019-06-09 DIAGNOSIS — M25462 Effusion, left knee: Secondary | ICD-10-CM | POA: Diagnosis not present

## 2019-06-09 DIAGNOSIS — R600 Localized edema: Secondary | ICD-10-CM | POA: Diagnosis not present

## 2019-06-09 NOTE — Therapy (Signed)
[place] [facility] [address] [place], [place], [address] Phone: [phone]   Fax:  [phone]  Physical Therapy Treatment  Patient Details  Name: Randall Schneider MRN: [id number] Date of Birth: 1930-04-07 Referring Provider (PT): Chipper Herb, NP   Encounter Date: 06/08/2019  PT End of Session - 06/09/19 1213    Visit Number  3    Number of Visits  9    Date for PT Re-Evaluation  06/24/19    Authorization - Visit Number  3    Authorization - Number of Visits  10    PT Start Time  1426    PT Stop Time  1521    PT Time Calculation (min)  55 min    Equipment Utilized During Treatment  Gait belt    Activity Tolerance  Patient tolerated treatment well    Behavior During Therapy  [facility] for tasks assessed/performed       Past Medical History:  Diagnosis Date  . Arthritis   . HOH (hard of hearing)   . Hypercholesteremia   . Hypertension     Past Surgical History:  Procedure Laterality Date  . CATARACT EXTRACTION W/PHACO Right 08/06/2018   Procedure: CATARACT EXTRACTION PHACO AND INTRAOCULAR LENS PLACEMENT ([place]) RIGHT DIABETES;  Surgeon: Leandrew Koyanagi, MD;  Location: [place];  Service: Ophthalmology;  Laterality: Right;  . CIRCUMCISION, NON-NEWBORN  05/07/2013  . HERNIA REPAIR    . TONSILLECTOMY     and adenoids  . TRANSURETHRAL RESECTION OF PROSTATE  05/07/2013    There were no vitals filed for this visit.  Subjective Assessment - 06/09/19 1211    Subjective  Pt. reports no falls or new issues.  Pts. wife states pt. is walking with increase step pattern while using RW.    Pertinent History  Pt. limited with information on PMHx and pts. wife had to assist.  Pt. was confused during evaluation.    Limitations  Lifting;House hold activities;Standing;Walking    Patient Stated Goals  Increase activity/ improve walking/ decrease fall risk    Currently in Pain?  No/denies         There.ex.:  Nustep L5 10 min.  B UE/LE (discussed activity at home/ use of RW) Seated GTB: hip abduction/ marching 30x each (mirror feedback) Sit to stands 10x with no UE assist.  (limited L knee extension noted).  STS 10x with Airex at blue mat table (SBA/CGA for safety).    Neuro.mm.:  Walking in hallway with use of RW 45 feet x 6 with mod. Cuing for proper technique/ RW use.   Walking in //-bars with 1 UE assist to no UE assist working on consistent recip. Pattern with hip flexion/ heel strike.  Pt. Requires CGA for safety and mod. A for verbal cuing to increase BOS/ hip flexion/ step pattern.   Walking outside with stepping down curb with RW use.   L knee stiffness t/o tx. Session and limits pt. Ability to ambulate without UE assist.       PT Long Term Goals - 05/31/19 0906      PT LONG TERM GOAL #1   Title  Pt. will be independent with HEP to increase B hip strength 1/2 muscle grade to improve standing/walking.    Baseline  B LE AROM WFL. B LE muscle strength grossly 5/5 MMT except hip flexion/ abduction 4/5 MMT.    Time  4    Period  Weeks    Status  New  Target Date  06/24/19      PT LONG TERM GOAL #2   Title  Pt. will increase Berg balance test to >44/56 to improve walking/ decrease fall risk.    Baseline  Berg: 37/56 (significant fall risk).    Time  4    Period  Weeks    Status  New    Target Date  06/24/19      PT LONG TERM GOAL #3   Title  Pt. will demonstrate a consistent hip flexion/ step pattern with use of RW to decrease fall risk/ improve mobility.    Baseline  Moderate shuffling gait pattern.  Limited hip flexion/ step length with heavy UE assist on RW.    Time  4    Period  Weeks    Status  New    Target Date  06/24/19      PT LONG TERM GOAL #4   Title  Pt. will be able to complete 30 minutes of standing/ ther.ex. with no loss of balance/ mod. independence safely.    Baseline  Pt. currently not exercising/ very inactive at home.    Time  4    Period  Weeks    Status  New     Target Date  06/24/19         Plan - 06/09/19 1213    Clinical Impression Statement  Moderate cuing t/o tx. session to instruct pt. to correct posture/ step pattern/ hip flexion.  Pt. has inconsistent step pattern and easily distracted during gait training/balance tasks.  L knee discomfort/ limited knee extension and moderate valgus noted during standing and walking tasks.  L knee joint swelling noted and pt. scheduled for f/u with MD tomorrow.  Pts. wife instructed to discuss L knee limitations with MD.    Stability/Clinical Decision Making  Unstable/Unpredictable    Clinical Decision Making  High    Rehab Potential  Fair    PT Frequency  2x / week    PT Duration  4 weeks    PT Treatment/Interventions  ADLs/Self Care Home Management;DME Instruction;Therapeutic activities;Functional mobility training;Stair training;Gait training;Therapeutic exercise;Balance training;Neuromuscular re-education;Cognitive remediation;Patient/family education;Manual techniques;Energy conservation;Passive range of motion    PT Next Visit Plan  Gait training/ balance activities.  Discuss MD f/u visit.    PT Home Exercise Plan  [id number]    Consulted and Agree with Plan of Care  Patient;Family member/caregiver       Patient will benefit from skilled therapeutic intervention in order to improve the following deficits and impairments:  Abnormal gait, Decreased balance, Decreased endurance, Decreased mobility, Difficulty walking, Improper body mechanics, Decreased range of motion, Decreased activity tolerance, Decreased coordination, Decreased knowledge of use of DME, Decreased safety awareness, Decreased strength, Postural dysfunction  Visit Diagnosis: Difficulty in walking, not elsewhere classified  Muscle weakness (generalized)  Abnormal posture     Problem List There are no problems to display for this patient.  Pura Spice, PT, DPT # [id number] 06/09/2019, 12:20 PM  [facility] [facility] [address] [place], [place], [address] Phone: [phone]   Fax:  [phone]  Name: Randall Schneider MRN: [id number] Date of Birth: 1930/12/27

## 2019-06-11 ENCOUNTER — Ambulatory Visit: Payer: PPO | Attending: Acute Care | Admitting: Physical Therapy

## 2019-06-11 ENCOUNTER — Encounter: Payer: Self-pay | Admitting: Physical Therapy

## 2019-06-11 ENCOUNTER — Other Ambulatory Visit: Payer: Self-pay

## 2019-06-11 DIAGNOSIS — R293 Abnormal posture: Secondary | ICD-10-CM | POA: Insufficient documentation

## 2019-06-11 DIAGNOSIS — M6281 Muscle weakness (generalized): Secondary | ICD-10-CM | POA: Insufficient documentation

## 2019-06-11 DIAGNOSIS — R262 Difficulty in walking, not elsewhere classified: Secondary | ICD-10-CM | POA: Insufficient documentation

## 2019-06-14 NOTE — Therapy (Signed)
[place] [facility] [address] [place], [place], [address] Phone: [phone]   Fax:  [phone]  Physical Therapy Treatment  Patient Details  Name: Randall Schneider MRN: [id number] Date of Birth: 09-29-1930 Referring Provider (PT): Chipper Herb, NP   Encounter Date: 06/11/2019  PT End of Session - 06/14/19 1130    Visit Number  4    Number of Visits  9    Date for PT Re-Evaluation  06/24/19    Authorization - Visit Number  4    Authorization - Number of Visits  10    PT Start Time  1430    PT Stop Time  1524    PT Time Calculation (min)  54 min    Equipment Utilized During Treatment  Gait belt    Activity Tolerance  Patient tolerated treatment well    Behavior During Therapy  [facility] for tasks assessed/performed       Past Medical History:  Diagnosis Date  . Arthritis   . HOH (hard of hearing)   . Hypercholesteremia   . Hypertension     Past Surgical History:  Procedure Laterality Date  . CATARACT EXTRACTION W/PHACO Right 08/06/2018   Procedure: CATARACT EXTRACTION PHACO AND INTRAOCULAR LENS PLACEMENT ([place]) RIGHT DIABETES;  Surgeon: Leandrew Koyanagi, MD;  Location: [place];  Service: Ophthalmology;  Laterality: Right;  . CIRCUMCISION, NON-NEWBORN  05/07/2013  . HERNIA REPAIR    . TONSILLECTOMY     and adenoids  . TRANSURETHRAL RESECTION OF PROSTATE  05/07/2013    There were no vitals filed for this visit.  Subjective Assessment - 06/14/19 1120    Subjective  Pt. reports no new issues.  Pt. unsure of what he has done over past couple of days.  Pt. states he is ready for PT today and excited about getting on Nustep.    Pertinent History  Pt. limited with information on PMHx and pts. wife had to assist.  Pt. was confused during evaluation.    Limitations  Lifting;House hold activities;Standing;Walking    Patient Stated Goals  Increase activity/ improve walking/ decrease fall risk    Currently in Pain?  No/denies           There.ex.:  Nustep L5 10 min. B UE/LE (discussed activity at home/ MD appt.).   Seated GTB: hip abduction/ marching 30x each (mirror feedback).  Standing hip marching/ lateral walking/ heel raises 20x.  STS 10x with Airex at blue mat table (SBA/CGA for safety).   Resisted gait 2BTB 5x all 4-planes (UE assist as needed) Reviewed HEP  Neuro.mm.:  Walking in hallway with use of RW and moderate cuing to increase hip flexion/ step pattern.  Pt. Inconsistent with step length, esp. When going over thresholds/ change in flooring material.   Walking in //-bars with 1 UE assist to no UE assist working on consistent recip. Pattern with hip flexion/ heel strike. Pt. Requires CGA for safety and mod. A for verbal cuing to increase BOS/ hip flexion/ step pattern.  Walking outside with stepping down curb with RW use. Agility ladder step pattern with blue lines to focus on consistent step length. Obstacle course (cone taps/ Airex pad/ maneuvering cones).         PT Long Term Goals - 05/31/19 0906      PT LONG TERM GOAL #1   Title  Pt. will be independent with HEP to increase B hip strength 1/2 muscle grade to improve standing/walking.    Baseline  B LE AROM WFL. B LE muscle strength grossly 5/5 MMT except hip flexion/ abduction 4/5 MMT.    Time  4    Period  Weeks    Status  New    Target Date  06/24/19      PT LONG TERM GOAL #2   Title  Pt. will increase Berg balance test to >44/56 to improve walking/ decrease fall risk.    Baseline  Berg: 37/56 (significant fall risk).    Time  4    Period  Weeks    Status  New    Target Date  06/24/19      PT LONG TERM GOAL #3   Title  Pt. will demonstrate a consistent hip flexion/ step pattern with use of RW to decrease fall risk/ improve mobility.    Baseline  Moderate shuffling gait pattern.  Limited hip flexion/ step length with heavy UE assist on RW.    Time  4    Period  Weeks    Status  New    Target Date  06/24/19      PT  LONG TERM GOAL #4   Title  Pt. will be able to complete 30 minutes of standing/ ther.ex. with no loss of balance/ mod. independence safely.    Baseline  Pt. currently not exercising/ very inactive at home.    Time  4    Period  Weeks    Status  New    Target Date  06/24/19            Plan - 06/14/19 1130    Clinical Impression Statement  Pt. remains limited by L LE wt. bearing due to L knee ROM limitations/ discomfort durng R swingthrough phase of gait.  Pt. requires moderate cuing t/o tx. session to ensure proper gait technique/ pattern and increase hip flexion/ step pattern.  Pts. MD did no imaging on L knee and did not recommend any change in POC.  Pt. instructed to continue with standing LE ex. program at home and walk on a consistent basis.    Stability/Clinical Decision Making  Unstable/Unpredictable    Clinical Decision Making  High    Rehab Potential  Fair    PT Frequency  2x / week    PT Duration  4 weeks    PT Treatment/Interventions  ADLs/Self Care Home Management;DME Instruction;Therapeutic activities;Functional mobility training;Stair training;Gait training;Therapeutic exercise;Balance training;Neuromuscular re-education;Cognitive remediation;Patient/family education;Manual techniques;Energy conservation;Passive range of motion    PT Next Visit Plan  Gait training/ balance activities.    PT Home Exercise Plan  [id number]    Consulted and Agree with Plan of Care  Patient;Family member/caregiver       Patient will benefit from skilled therapeutic intervention in order to improve the following deficits and impairments:  Abnormal gait, Decreased balance, Decreased endurance, Decreased mobility, Difficulty walking, Improper body mechanics, Decreased range of motion, Decreased activity tolerance, Decreased coordination, Decreased knowledge of use of DME, Decreased safety awareness, Decreased strength, Postural dysfunction  Visit Diagnosis: Difficulty in walking, not elsewhere  classified  Muscle weakness (generalized)  Abnormal posture     Problem List There are no problems to display for this patient.  Pura Spice, PT, DPT # [phone] 06/14/2019, 11:35 AM  [place] [facility] [address] [place], [place], [address] Phone: [phone]   Fax:  [phone]  Name: Randall Schneider MRN: [id number] Date of Birth: [birth year]

## 2019-06-15 ENCOUNTER — Encounter: Payer: Self-pay | Admitting: Physical Therapy

## 2019-06-15 ENCOUNTER — Other Ambulatory Visit: Payer: Self-pay

## 2019-06-15 ENCOUNTER — Ambulatory Visit: Payer: PPO | Admitting: Physical Therapy

## 2019-06-15 DIAGNOSIS — R293 Abnormal posture: Secondary | ICD-10-CM

## 2019-06-15 DIAGNOSIS — R262 Difficulty in walking, not elsewhere classified: Secondary | ICD-10-CM

## 2019-06-15 DIAGNOSIS — M6281 Muscle weakness (generalized): Secondary | ICD-10-CM

## 2019-06-15 NOTE — Therapy (Signed)
[place] [facility] [address] [place], [place], [address] Phone: [phone]   Fax:  [phone]  Physical Therapy Treatment  Patient Details  Name: Randall Schneider MRN: [id number] Date of Birth: Jan 18, 1931 Referring Provider (PT): Chipper Herb, NP   Encounter Date: 06/15/2019  PT End of Session - 06/15/19 1942    Visit Number  5    Number of Visits  9    Date for PT Re-Evaluation  06/24/19    Authorization - Visit Number  5    Authorization - Number of Visits  10    PT Start Time  [id number]    PT Stop Time  1503    PT Time Calculation (min)  49 min    Equipment Utilized During Treatment  Gait belt    Activity Tolerance  Patient tolerated treatment well    Behavior During Therapy  [facility] for tasks assessed/performed       Past Medical History:  Diagnosis Date  . Arthritis   . HOH (hard of hearing)   . Hypercholesteremia   . Hypertension     Past Surgical History:  Procedure Laterality Date  . CATARACT EXTRACTION W/PHACO Right 08/06/2018   Procedure: CATARACT EXTRACTION PHACO AND INTRAOCULAR LENS PLACEMENT ([place]) RIGHT DIABETES;  Surgeon: Leandrew Koyanagi, MD;  Location: [place];  Service: Ophthalmology;  Laterality: Right;  . CIRCUMCISION, NON-NEWBORN  05/07/2013  . HERNIA REPAIR    . TONSILLECTOMY     and adenoids  . TRANSURETHRAL RESECTION OF PROSTATE  05/07/2013    There were no vitals filed for this visit.  Subjective Assessment - 06/15/19 1940    Subjective  Pt. entered PT by himself with use of RW.  Pts. wife was going for a walk during PT tx. session.  Pt. reports no falls or LOB and was working on increase hip flexion/ step pattern while walking into PT clinic.  Pt. c/o stiffness/ swelling in L knee and PT discussed with pt. that he recently went to MD.    Pertinent History  Pt. limited with information on PMHx and pts. wife had to assist.  Pt. was confused during evaluation.    Limitations  Lifting;House  hold activities;Standing;Walking    Patient Stated Goals  Increase activity/ improve walking/ decrease fall risk    Currently in Pain?  No/denies         There.ex.:  Nustep L510 min. B UE/LE (discussed activity at home). Seated4# ankle wts.: marching/ LAQ/ heel raises 30x each.  Standing hip marching/ lateral walking/ step ups at stairs (heavy UE assist).  Step lunges (partial) at blue mat with limited UE assist 10x L/R.  STS 10x with no UE assist at green chair/ blue mat.  Neuro.mm.:  Walking in hallway with use of RW and moderate cuing to increase hip flexion/ step pattern. (6 laps) Walking outside with rollator and increase cadence (1/2 around building)- mod. Cuing to increase hip flexion/ step length while descending ramp.  Agility ladder step pattern with blue lines to focus on consistent step length (3x).   PT Long Term Goals - 05/31/19 0906      PT LONG TERM GOAL #1   Title  Pt. will be independent with HEP to increase B hip strength 1/2 muscle grade to improve standing/walking.    Baseline  B LE AROM WFL. B LE muscle strength grossly 5/5 MMT except hip flexion/ abduction 4/5 MMT.    Time  4    Period  Weeks  Status  New    Target Date  06/24/19      PT LONG TERM GOAL #2   Title  Pt. will increase Berg balance test to >44/56 to improve walking/ decrease fall risk.    Baseline  Berg: 37/56 (significant fall risk).    Time  4    Period  Weeks    Status  New    Target Date  06/24/19      PT LONG TERM GOAL #3   Title  Pt. will demonstrate a consistent hip flexion/ step pattern with use of RW to decrease fall risk/ improve mobility.    Baseline  Moderate shuffling gait pattern.  Limited hip flexion/ step length with heavy UE assist on RW.    Time  4    Period  Weeks    Status  New    Target Date  06/24/19      PT LONG TERM GOAL #4   Title  Pt. will be able to complete 30 minutes of standing/ ther.ex. with no loss of balance/ mod. independence safely.     Baseline  Pt. currently not exercising/ very inactive at home.    Time  4    Period  Weeks    Status  New    Target Date  06/24/19            Plan - 06/15/19 1943    Clinical Impression Statement  Pt. ambulates 6 laps in hallway working on a more consistent gait pattern/ cadence.  Pt. will occasional shuffle/ drap feet but able to correct with verbal cuing/ demonstration.  Pt. ambulates with improved cadence outside while using rollator vs. RW (pts. wife has difficulty transporting rollator and primarily uses at home).  Heavy UE assist with stair climbing and no UE assist with sit to stands from low green chair in clinic.  Pt. required few seated rest breaks during tx./ resisted ther.ex.  No change to HEP at this time.    Stability/Clinical Decision Making  Unstable/Unpredictable    Clinical Decision Making  High    Rehab Potential  Fair    PT Frequency  2x / week    PT Duration  4 weeks    PT Treatment/Interventions  ADLs/Self Care Home Management;DME Instruction;Therapeutic activities;Functional mobility training;Stair training;Gait training;Therapeutic exercise;Balance training;Neuromuscular re-education;Cognitive remediation;Patient/family education;Manual techniques;Energy conservation;Passive range of motion    PT Next Visit Plan  Gait training/ balance activities.    PT Home Exercise Plan  [id number]    Consulted and Agree with Plan of Care  Patient;Family member/caregiver       Patient will benefit from skilled therapeutic intervention in order to improve the following deficits and impairments:  Abnormal gait, Decreased balance, Decreased endurance, Decreased mobility, Difficulty walking, Improper body mechanics, Decreased range of motion, Decreased activity tolerance, Decreased coordination, Decreased knowledge of use of DME, Decreased safety awareness, Decreased strength, Postural dysfunction  Visit Diagnosis: Difficulty in walking, not elsewhere classified  Muscle weakness  (generalized)  Abnormal posture     Problem List There are no problems to display for this patient.  Pura Spice, PT, DPT # [id number] 06/15/2019, 7:47 PM  [place] [facility] [address] [place], [place], [address] Phone: [phone]   Fax:  [phone]  Name: ZAYAAN SANGHERA MRN: [id number] Date of Birth: 1930/11/06

## 2019-06-18 ENCOUNTER — Ambulatory Visit: Payer: PPO | Admitting: Physical Therapy

## 2019-06-22 ENCOUNTER — Ambulatory Visit: Payer: PPO | Admitting: Physical Therapy

## 2019-06-23 DIAGNOSIS — M25562 Pain in left knee: Secondary | ICD-10-CM | POA: Diagnosis not present

## 2019-06-25 ENCOUNTER — Other Ambulatory Visit: Payer: Self-pay

## 2019-06-25 ENCOUNTER — Ambulatory Visit: Payer: PPO | Admitting: Physical Therapy

## 2019-06-25 DIAGNOSIS — M6281 Muscle weakness (generalized): Secondary | ICD-10-CM

## 2019-06-25 DIAGNOSIS — R262 Difficulty in walking, not elsewhere classified: Secondary | ICD-10-CM

## 2019-06-25 DIAGNOSIS — R293 Abnormal posture: Secondary | ICD-10-CM

## 2019-06-28 ENCOUNTER — Encounter: Payer: Self-pay | Admitting: Physical Therapy

## 2019-06-28 NOTE — Therapy (Signed)
[facility] [address] [place], [place], [address] Phone: [phone]   Fax:  [phone]  Physical Therapy Treatment  Patient Details  Name: Randall Schneider MRN: [id number] Date of Birth: May 28, 1930 Referring Provider (PT): Chipper Herb, NP   Encounter Date: 06/25/2019  PT End of Session - 06/28/19 1606    Visit Number  6    Number of Visits  14    Date for PT Re-Evaluation  07/23/19    Authorization - Visit Number  1    Authorization - Number of Visits  10    PT Start Time  1426    PT Stop Time  1517    PT Time Calculation (min)  51 min    Equipment Utilized During Treatment  Gait belt    Activity Tolerance  Patient tolerated treatment well;Patient limited by pain    Behavior During Therapy  [facility] for tasks assessed/performed       Past Medical History:  Diagnosis Date  . Arthritis   . HOH (hard of hearing)   . Hypercholesteremia   . Hypertension     Past Surgical History:  Procedure Laterality Date  . CATARACT EXTRACTION W/PHACO Right 08/06/2018   Procedure: CATARACT EXTRACTION PHACO AND INTRAOCULAR LENS PLACEMENT ([place]) RIGHT DIABETES;  Surgeon: Leandrew Koyanagi, MD;  Location: [place];  Service: Ophthalmology;  Laterality: Right;  . CIRCUMCISION, NON-NEWBORN  05/07/2013  . HERNIA REPAIR    . TONSILLECTOMY     and adenoids  . TRANSURETHRAL RESECTION OF PROSTATE  05/07/2013    There were no vitals filed for this visit.  Subjective Assessment - 06/28/19 1557    Subjective  Pt. had appt. with Dr. Posey Pronto to assess L knee swelling/pain.  Pt. received cortisone injection with minmal improvement reported.  MD notes recommends TKA but pt./wife concerned due to pts. dementia.    Pertinent History  Pt. limited with information on PMHx and pts. wife had to assist.  Pt. was confused during evaluation.    Limitations  Lifting;House hold activities;Standing;Walking    Patient Stated Goals  Increase activity/  improve walking/ decrease fall risk    Currently in Pain?  Yes    Pain Score  --   no subjective pain score   Pain Location  Knee    Pain Orientation  Left    Pain Descriptors / Indicators  Aching         [facility] PT Assessment - 06/28/19 0001      Assessment   Medical Diagnosis  Unsteady Gait    Referring Provider (PT)  Chipper Herb, NP    Onset Date/Surgical Date  03/12/18      Precautions   Precautions  Fall      Prior Function   Level of Independence  Requires assistive device for independence    Vocation  Retired      Associate Professor   Overall Cognitive Status  History of cognitive impairments - at baseline    Memory  Impaired        There.ex.:  Nustep L510 min. B UE/LE (discussed MD appt.). Seated4# ankle wts.: marching/ LAQ/ heel raises 30x each. Standing hip marching/ lateral walking/ step ups at stairs (heavy UE assist).  Step lunges (partial) at blue mat with limited UE assist 10x L/R.  Stairs (step to pattern) with handrail assist 4 steps x 4.    Neuro.mm.:  Walking in hallway/gym with use of RWand moderate cuing to increase hip flexion/ step pattern. (  6 laps) Walking outside with rollator and increase cadence (1/2 around building)- mod. Cuing to increase hip flexion/ step length while descending ramp.  Obstacle course in hallway (cone taps/ maneuvering cones)       PT Long Term Goals - 06/28/19 1613      PT LONG TERM GOAL #1   Title  Pt. will be independent with HEP to increase B hip strength 1/2 muscle grade to improve standing/walking.    Baseline  B LE AROM WFL. B LE muscle strength grossly 5/5 MMT except hip flexion/ abduction 4/5 MMT.  4/15: Good quad/hamsring strength.  Hip flexor weakness    Time  4    Period  Weeks    Status  Partially Met    Target Date  07/23/19      PT LONG TERM GOAL #2   Title  Pt. will increase Berg balance test to >44/56 to improve walking/ decrease fall risk.    Baseline  Berg: 37/56 (significant fall risk).     Time  4    Period  Weeks    Status  On-going    Target Date  07/23/19      PT LONG TERM GOAL #3   Title  Pt. will demonstrate a consistent hip flexion/ step pattern with use of RW to decrease fall risk/ improve mobility.    Baseline  Moderate shuffling gait pattern.  Limited hip flexion/ step length with heavy UE assist on RW.    Time  4    Period  Weeks    Status  Not Met    Target Date  07/23/19      PT LONG TERM GOAL #4   Title  Pt. will be able to complete 30 minutes of standing/ ther.ex. with no loss of balance/ mod. independence safely.    Baseline  Pt. currently not exercising/ very inactive at home.    Time  4    Period  Weeks    Status  Not Met    Target Date  07/23/19            Plan - 06/28/19 1607    Clinical Impression Statement  Pt. remains limited by L LE wt. bearing due to L knee ROM limitations/ discomfort durng wt. bearing/ R swingthrough phase of gait. Pt. requires moderate cuing t/o tx. session to ensure proper gait technique/ pattern and increase hip flexion/ step pattern.  Pt. will occasional drag R foot due to increase L knee pain discomfort/ lack of extension.  Pt. using RW today with heavy UE assist for safety/ balance.  No recent LOB.  See updated goals.  Pt. will benefit from continued skilled PT services to increase LE muscle strength/ safety with ambulation.    Stability/Clinical Decision Making  Unstable/Unpredictable    Clinical Decision Making  Moderate    Rehab Potential  Fair    PT Frequency  2x / week    PT Duration  4 weeks    PT Treatment/Interventions  ADLs/Self Care Home Management;DME Instruction;Therapeutic activities;Functional mobility training;Stair training;Gait training;Therapeutic exercise;Balance training;Neuromuscular re-education;Cognitive remediation;Patient/family education;Manual techniques;Energy conservation;Passive range of motion    PT Next Visit Plan  Gait training/ balance activities.    PT Home Exercise Plan  [id number]     Consulted and Agree with Plan of Care  Patient;Family member/caregiver       Patient will benefit from skilled therapeutic intervention in order to improve the following deficits and impairments:  Abnormal gait, Decreased balance, Decreased endurance, Decreased mobility, Difficulty  walking, Improper body mechanics, Decreased range of motion, Decreased activity tolerance, Decreased coordination, Decreased knowledge of use of DME, Decreased safety awareness, Decreased strength, Postural dysfunction  Visit Diagnosis: Difficulty in walking, not elsewhere classified  Muscle weakness (generalized)  Abnormal posture     Problem List There are no problems to display for this patient.  Pura Spice, PT, DPT # [phone] 06/28/2019, 4:17 PM  [facility] [address] [place], [place], [address] Phone: [phone]   Fax:  [phone]  Name: REASE WENCE MRN: [id number] Date of Birth: 1930-04-06

## 2019-06-29 ENCOUNTER — Other Ambulatory Visit: Payer: Self-pay

## 2019-06-29 ENCOUNTER — Encounter: Payer: Self-pay | Admitting: Physical Therapy

## 2019-06-29 ENCOUNTER — Ambulatory Visit: Payer: PPO | Admitting: Physical Therapy

## 2019-06-29 DIAGNOSIS — R262 Difficulty in walking, not elsewhere classified: Secondary | ICD-10-CM

## 2019-06-29 DIAGNOSIS — R293 Abnormal posture: Secondary | ICD-10-CM

## 2019-06-29 DIAGNOSIS — M6281 Muscle weakness (generalized): Secondary | ICD-10-CM

## 2019-06-29 NOTE — Therapy (Signed)
[facility] [address] [place], [place], [address] Phone: [phone]   Fax:  [phone]  Physical Therapy Treatment  Patient Details  Name: Randall Schneider MRN: [id number] Date of Birth: 1930/12/07 Referring Provider (PT): Chipper Herb, NP   Encounter Date: 06/29/2019  Treatment: 7 of 14. Recert date: 8/67/6195 1426 to 1516   Past Medical History:  Diagnosis Date  . Arthritis   . HOH (hard of hearing)   . Hypercholesteremia   . Hypertension     Past Surgical History:  Procedure Laterality Date  . CATARACT EXTRACTION W/PHACO Right 08/06/2018   Procedure: CATARACT EXTRACTION PHACO AND INTRAOCULAR LENS PLACEMENT ([place]) RIGHT DIABETES;  Surgeon: Leandrew Koyanagi, MD;  Location: [place];  Service: Ophthalmology;  Laterality: Right;  . CIRCUMCISION, NON-NEWBORN  05/07/2013  . HERNIA REPAIR    . TONSILLECTOMY     and adenoids  . TRANSURETHRAL RESECTION OF PROSTATE  05/07/2013    There were no vitals filed for this visit.    Pt. reports no L knee pain, just soreness prior to PT tx. session. Pt. reports no falls.        Moderate B lower leg pitting edema/ significant B hamstring tightness,     There.ex.:  Nustep L610 min. B UE/LE (discussed activity over weekend/ wedding).  Seated5# ankle wts.: marching/ LAQ/ heel raises30x each.Standing hip marching/ lateral walking/step ups at stairs (heavy UE assist).  Stairs with 5# ankle wts. (step to pattern) with handrail assist 4 steps x 4.    Supine SLR/ heel slides/ bolster bridging 20x.  Supine hamstring/ hip stretches.      Neuro.mm.:  Walking in hallway/gym with use of RWand moderate cuing to increase hip flexion/ step pattern.  Walking outside withRW and increase cadence (1/2 around building)- mod. Cuing to increase hip flexion/ step length while descending ramp.  No obstacle course today due to increase fatigue/ prolonged walking  outside.       PT Long Term Goals - 06/28/19 1613      PT LONG TERM GOAL #1   Title  Pt. will be independent with HEP to increase B hip strength 1/2 muscle grade to improve standing/walking.    Baseline  B LE AROM WFL. B LE muscle strength grossly 5/5 MMT except hip flexion/ abduction 4/5 MMT.  4/15: Good quad/hamsring strength.  Hip flexor weakness    Time  4    Period  Weeks    Status  Partially Met    Target Date  07/23/19      PT LONG TERM GOAL #2   Title  Pt. will increase Berg balance test to >44/56 to improve walking/ decrease fall risk.    Baseline  Berg: 37/56 (significant fall risk).    Time  4    Period  Weeks    Status  On-going    Target Date  07/23/19      PT LONG TERM GOAL #3   Title  Pt. will demonstrate a consistent hip flexion/ step pattern with use of RW to decrease fall risk/ improve mobility.    Baseline  Moderate shuffling gait pattern.  Limited hip flexion/ step length with heavy UE assist on RW.    Time  4    Period  Weeks    Status  Not Met    Target Date  07/23/19      PT LONG TERM GOAL #4   Title  Pt. will be able to complete 30 minutes  of standing/ ther.ex. with no loss of balance/ mod. independence safely.    Baseline  Pt. currently not exercising/ very inactive at home.    Time  4    Period  Weeks    Status  Not Met    Target Date  07/23/19        PT focused on increase walking in clinic/ gait training to ambulate with more consistent step pattern. Pt. inconsistent with step length/ hip flexion/ heel strike and requires mod. cuing today while using RW. No LOB but increase L knee pain with increase distance walked. Pt. works really hard during tx. session but requires mod. cuing t/o due to memory issues.       Patient will benefit from skilled therapeutic intervention in order to improve the following deficits and impairments:  Abnormal gait, Decreased balance, Decreased endurance, Decreased mobility, Difficulty walking, Improper body  mechanics, Decreased range of motion, Decreased activity tolerance, Decreased coordination, Decreased knowledge of use of DME, Decreased safety awareness, Decreased strength, Postural dysfunction  Visit Diagnosis: Difficulty in walking, not elsewhere classified  Muscle weakness (generalized)  Abnormal posture     Problem List There are no problems to display for this patient.  Pura Spice, PT, DPT # [phone] 07/04/2019, 8:50 AM  [facility] [address] [place], [place], [address] Phone: [phone]   Fax:  [phone]  Name: Randall Schneider MRN: [id number] Date of Birth: August 18, 1930

## 2019-07-02 ENCOUNTER — Other Ambulatory Visit: Payer: Self-pay

## 2019-07-02 ENCOUNTER — Ambulatory Visit: Payer: PPO | Admitting: Physical Therapy

## 2019-07-02 DIAGNOSIS — R262 Difficulty in walking, not elsewhere classified: Secondary | ICD-10-CM

## 2019-07-02 DIAGNOSIS — R293 Abnormal posture: Secondary | ICD-10-CM

## 2019-07-02 DIAGNOSIS — M6281 Muscle weakness (generalized): Secondary | ICD-10-CM

## 2019-07-04 NOTE — Therapy (Addendum)
[facility] [facility] [address] [place], [place], [address] Phone: [phone]   Fax:  [phone]  Physical Therapy Treatment  Patient Details  Name: Randall Schneider MRN: [id number] Date of Birth: 1930-05-19 Referring Provider (PT): Chipper Herb, NP   Encounter Date: 07/02/2019   PT End of Session - 07/04/19 1337    Visit Number  8    Number of Visits  14    Date for PT Re-Evaluation  07/23/19    Authorization - Visit Number  3    Authorization - Number of Visits  10    PT Start Time  1430    PT Stop Time  1516    PT Time Calculation (min)  46 min    Activity Tolerance  Patient tolerated treatment well    Behavior During Therapy  [facility] for tasks assessed/performed          Past Medical History:  Diagnosis Date  . Arthritis   . HOH (hard of hearing)   . Hypercholesteremia   . Hypertension     Past Surgical History:  Procedure Laterality Date  . CATARACT EXTRACTION W/PHACO Right 08/06/2018   Procedure: CATARACT EXTRACTION PHACO AND INTRAOCULAR LENS PLACEMENT ([place]) RIGHT DIABETES;  Surgeon: Leandrew Koyanagi, MD;  Location: [place];  Service: Ophthalmology;  Laterality: Right;  . CIRCUMCISION, NON-NEWBORN  05/07/2013  . HERNIA REPAIR    . TONSILLECTOMY     and adenoids  . TRANSURETHRAL RESECTION OF PROSTATE  05/07/2013    There were no vitals filed for this visit.  Subjective Assessment - 08/02/19 1233    Subjective  Pt. had no falls and entered PT with use RW.   Pt. states L knee pain remains.  Pts. wife states they are planning to take a trip to [place] in a few weeks.    Pertinent History  Pt. limited with information on PMHx and pts. wife had to assist.  Pt. was confused during evaluation.    Limitations  Lifting;House hold activities;Standing;Walking    Patient Stated Goals  Increase activity/ improve walking/ decrease fall risk    Currently in Pain?  Yes    Pain Location  Knee    Pain Orientation   Left         There.ex.:  Nustep L610 min. B UE/LE.   Pt. States knee pain is okay  Seated5# ankle wts.: marching/ LAQ/ heel raises30x each.Standing hip marching/ lateral walking/step ups at stairs (heavy UE assist).  Stairs with 5# ankle wts. (step to pattern) with handrail assist 4 steps x 4.   Walking in PT clinic with ankle wts. Working on consistent step pattern/ heel strike.  Stepping over green hurdles 5x  Supine SLR/ heel slides/ bolster bridging 20x.  Supine hamstring/ hip stretches.    Neuro.mm.:  Walking in hallway/gymwith use of RWand moderate cuing to increase hip flexion/ step pattern.  Walking at varying cadence in hallway/ head turns/ stepping over plinths and [employer] pads.    Working on proper technique/ posture with STS (preventing posterior lean/ LOB).         PT Long Term Goals - 06/28/19 1613      PT LONG TERM GOAL #1   Title  Pt. will be independent with HEP to increase B hip strength 1/2 muscle grade to improve standing/walking.    Baseline  B LE AROM WFL. B LE muscle strength grossly 5/5 MMT except hip flexion/ abduction 4/5 MMT.  4/15: Good quad/hamsring strength.  Hip flexor weakness    Time  4    Period  Weeks    Status  Partially Met    Target Date  07/23/19      PT LONG TERM GOAL #2   Title  Pt. will increase Berg balance test to >44/56 to improve walking/ decrease fall risk.    Baseline  Berg: 37/56 (significant fall risk).    Time  4    Period  Weeks    Status  On-going    Target Date  07/23/19      PT LONG TERM GOAL #3   Title  Pt. will demonstrate a consistent hip flexion/ step pattern with use of RW to decrease fall risk/ improve mobility.    Baseline  Moderate shuffling gait pattern.  Limited hip flexion/ step length with heavy UE assist on RW.    Time  4    Period  Weeks    Status  Not Met    Target Date  07/23/19      PT LONG TERM GOAL #4   Title  Pt. will be able to complete 30 minutes of standing/ ther.ex.  with no loss of balance/ mod. independence safely.    Baseline  Pt. currently not exercising/ very inactive at home.    Time  4    Period  Weeks    Status  Not Met    Target Date  07/23/19            Plan - 08/02/19 1240    Clinical Impression Statement  Several episodes of posterior leanding with STS and turning activites.  Pt. requires CGA for safety and pt. working on self-correcting LOB.  Persistent L knee pain and limited with manual tx. to knee/ patella.  No change to HEP and pt. instructed to work on walking/ endurance as tolerated.    Stability/Clinical Decision Making  Unstable/Unpredictable    Clinical Decision Making  High    Rehab Potential  Fair    PT Frequency  2x / week    PT Duration  4 weeks    PT Treatment/Interventions  ADLs/Self Care Home Management;DME Instruction;Therapeutic activities;Functional mobility training;Stair training;Gait training;Therapeutic exercise;Balance training;Neuromuscular re-education;Cognitive remediation;Patient/family education;Manual techniques;Energy conservation;Passive range of motion    PT Next Visit Plan  Gait training/ balance activities.  Probable discharge next tx. session.    PT Home Exercise Plan  [id number]    Consulted and Agree with Plan of Care  Patient;Family member/caregiver       Patient will benefit from skilled therapeutic intervention in order to improve the following deficits and impairments:  Abnormal gait, Decreased balance, Decreased endurance, Decreased mobility, Difficulty walking, Improper body mechanics, Decreased range of motion, Decreased activity tolerance, Decreased coordination, Decreased knowledge of use of DME, Decreased safety awareness, Decreased strength, Postural dysfunction  Visit Diagnosis: Difficulty in walking, not elsewhere classified  Muscle weakness (generalized)  Abnormal posture     Problem List There are no problems to display for this patient.  Pura Spice, PT, DPT #  [phone] 08/02/2019, 12:44 PM  [facility] [address] [place], [place], [address] Phone: [phone]   Fax:  [phone]  Name: Randall Schneider MRN: [id number] Date of Birth: 04-22-1930

## 2019-07-06 ENCOUNTER — Other Ambulatory Visit: Payer: Self-pay

## 2019-07-06 ENCOUNTER — Ambulatory Visit: Payer: PPO | Admitting: Physical Therapy

## 2019-07-06 ENCOUNTER — Encounter: Payer: Self-pay | Admitting: Physical Therapy

## 2019-07-06 DIAGNOSIS — M6281 Muscle weakness (generalized): Secondary | ICD-10-CM

## 2019-07-06 DIAGNOSIS — R293 Abnormal posture: Secondary | ICD-10-CM

## 2019-07-06 DIAGNOSIS — R262 Difficulty in walking, not elsewhere classified: Secondary | ICD-10-CM | POA: Diagnosis not present

## 2019-07-08 ENCOUNTER — Ambulatory Visit: Payer: PPO | Admitting: Physical Therapy

## 2019-07-10 NOTE — Therapy (Addendum)
[facility] [address] [place], [place], [address] Phone: [phone]   Fax:  [phone]  Physical Therapy Treatment  Patient Details  Name: Randall Schneider MRN: [id number] Date of Birth: [birth year] Referring Provider (PT): Chipper Herb, NP   Encounter Date: 07/06/2019  Treatment: 9 of 14.  Discharge  1433 to 1520   Past Medical History:  Diagnosis Date  . Arthritis   . HOH (hard of hearing)   . Hypercholesteremia   . Hypertension     Past Surgical History:  Procedure Laterality Date  . CATARACT EXTRACTION W/PHACO Right 08/06/2018   Procedure: CATARACT EXTRACTION PHACO AND INTRAOCULAR LENS PLACEMENT ([place]) RIGHT DIABETES;  Surgeon: Leandrew Koyanagi, MD;  Location: [place];  Service: Ophthalmology;  Laterality: Right;  . CIRCUMCISION, NON-NEWBORN  05/07/2013  . HERNIA REPAIR    . TONSILLECTOMY     and adenoids  . TRANSURETHRAL RESECTION OF PROSTATE  05/07/2013    There were no vitals filed for this visit.   Pts. wife reports pt. is doing okay. Pt. had no falls and entered PT with use RW. Pt. states L knee pain continues to be constant/persistent with standing and walking tasks.      There.ex.:  Nustep L610 min. B UE/LE.     Seated5# ankle wts.: marching/ LAQ/ heel raises30x each.Standing hip marching/ lateral walking/step ups at stairs (heavy UE assist).  Supine SLR/ heel slides/ bolster bridging 20x. Supine hamstring/ hip stretches.   Reviewed HEP in depth   Neuro.mm.:  Working on proper technique/ posture with STS (preventing posterior lean/ LOB).  No UE assist but extra time.  Tandem stance/ gait.  Berg balance: 42/56  Walking in hallway/ outside with use of RWand moderate cuing to increase hip flexion/ step pattern.      PT Long Term Goals - 08/02/19 1752      PT LONG TERM GOAL #1   Title  Pt. will be independent with HEP to increase B hip strength 1/2 muscle  grade to improve standing/walking.    Baseline  B LE AROM WFL. B LE muscle strength grossly 5/5 MMT except hip flexion/ abduction 4/5 MMT.  4/15: Good quad/hamsring strength.  Hip flexor weakness    Time  4    Period  Weeks    Status  Partially Met    Target Date  07/06/19      PT LONG TERM GOAL #2   Title  Pt. will increase Berg balance test to >44/56 to improve walking/ decrease fall risk.    Baseline  Berg: 37/56 (significant fall risk).  4/26: 42/56    Time  4    Period  Weeks    Status  Partially Met    Target Date  07/06/19      PT LONG TERM GOAL #3   Title  Pt. will demonstrate a consistent hip flexion/ step pattern with use of RW to decrease fall risk/ improve mobility.    Baseline  Moderate shuffling gait pattern.  Limited hip flexion/ step length with heavy UE assist on RW.    Time  4    Period  Weeks    Status  Partially Met    Target Date  07/06/19      PT LONG TERM GOAL #4   Title  Pt. will be able to complete 30 minutes of standing/ ther.ex. with no loss of balance/ mod. independence safely.    Baseline  Pt. currently not exercising/ very inactive at  home.  4/26: knee pain/limitations    Time  4    Period  Weeks    Status  Partially Met    Target Date  07/06/19            Plan - 08/02/19 1749    Clinical Impression Statement  Discharge from PT at this time.  Pt. has shown progress and will continue to benefit from assistive device due to knee pain/ limitations.  PT discussed pts. limitations with walking endurance/ distance secondary to knee pain.  No falls or LOB during tx. session.  Discharge from PT at this time with focus on independent walking/ HEP.  Pt. will benefit from use of w/c or scooter while at [facility] due to knee/walking limitations.    Stability/Clinical Decision Making  Unstable/Unpredictable    Clinical Decision Making  High    Rehab Potential  Fair    PT Frequency  2x / week    PT Duration  4 weeks    PT Treatment/Interventions   ADLs/Self Care Home Management;DME Instruction;Therapeutic activities;Functional mobility training;Stair training;Gait training;Therapeutic exercise;Balance training;Neuromuscular re-education;Cognitive remediation;Patient/family education;Manual techniques;Energy conservation;Passive range of motion    PT Next Visit Plan  Discharge    PT [place]    Consulted and Agree with Plan of Care  Patient;Family member/caregiver       Patient will benefit from skilled therapeutic intervention in order to improve the following deficits and impairments:  Abnormal gait, Decreased balance, Decreased endurance, Decreased mobility, Difficulty walking, Improper body mechanics, Decreased range of motion, Decreased activity tolerance, Decreased coordination, Decreased knowledge of use of DME, Decreased safety awareness, Decreased strength, Postural dysfunction  Visit Diagnosis: Difficulty in walking, not elsewhere classified  Muscle weakness (generalized)  Abnormal posture     Problem List There are no problems to display for this patient.  Pura Spice, PT, DPT # [phone] 08/02/2019, 5:54 PM  [place] [facility] [address] [place], [place], [address] Phone: [phone]   Fax:  [phone]  Name: Randall Schneider MRN: [id number] Date of Birth: [birth year]

## 2019-09-25 DIAGNOSIS — R413 Other amnesia: Secondary | ICD-10-CM | POA: Diagnosis not present

## 2019-10-14 DIAGNOSIS — R413 Other amnesia: Secondary | ICD-10-CM | POA: Diagnosis not present

## 2019-10-14 DIAGNOSIS — E7800 Pure hypercholesterolemia, unspecified: Secondary | ICD-10-CM | POA: Diagnosis not present

## 2019-10-14 DIAGNOSIS — F320 Major depressive disorder, single episode, mild: Secondary | ICD-10-CM | POA: Diagnosis not present

## 2019-10-14 DIAGNOSIS — M129 Arthropathy, unspecified: Secondary | ICD-10-CM | POA: Diagnosis not present

## 2019-10-14 DIAGNOSIS — I10 Essential (primary) hypertension: Secondary | ICD-10-CM | POA: Diagnosis not present

## 2019-10-19 DIAGNOSIS — E7800 Pure hypercholesterolemia, unspecified: Secondary | ICD-10-CM | POA: Diagnosis not present

## 2020-02-15 DIAGNOSIS — M79674 Pain in right toe(s): Secondary | ICD-10-CM | POA: Diagnosis not present

## 2020-02-15 DIAGNOSIS — I87303 Chronic venous hypertension (idiopathic) without complications of bilateral lower extremity: Secondary | ICD-10-CM | POA: Diagnosis not present

## 2020-02-15 DIAGNOSIS — B351 Tinea unguium: Secondary | ICD-10-CM | POA: Diagnosis not present

## 2020-02-15 DIAGNOSIS — M79675 Pain in left toe(s): Secondary | ICD-10-CM | POA: Diagnosis not present

## 2020-05-25 ENCOUNTER — Emergency Department: Payer: Medicare Other

## 2020-05-25 ENCOUNTER — Other Ambulatory Visit: Payer: Self-pay

## 2020-05-25 ENCOUNTER — Emergency Department
Admission: EM | Admit: 2020-05-25 | Discharge: 2020-05-25 | Disposition: A | Discharge status: Home / Self Care | Payer: Medicare Other | Attending: Emergency Medicine | Admitting: Emergency Medicine

## 2020-05-25 DIAGNOSIS — Z20822 Contact with and (suspected) exposure to covid-19: Secondary | ICD-10-CM | POA: Insufficient documentation

## 2020-05-25 DIAGNOSIS — R531 Weakness: Secondary | ICD-10-CM | POA: Diagnosis not present

## 2020-05-25 DIAGNOSIS — Z7982 Long term (current) use of aspirin: Secondary | ICD-10-CM | POA: Insufficient documentation

## 2020-05-25 DIAGNOSIS — F0281 Dementia in other diseases classified elsewhere with behavioral disturbance: Secondary | ICD-10-CM | POA: Diagnosis not present

## 2020-05-25 DIAGNOSIS — Z79899 Other long term (current) drug therapy: Secondary | ICD-10-CM | POA: Insufficient documentation

## 2020-05-25 DIAGNOSIS — R1030 Lower abdominal pain, unspecified: Secondary | ICD-10-CM | POA: Insufficient documentation

## 2020-05-25 DIAGNOSIS — G309 Alzheimer's disease, unspecified: Secondary | ICD-10-CM | POA: Diagnosis not present

## 2020-05-25 DIAGNOSIS — Z87891 Personal history of nicotine dependence: Secondary | ICD-10-CM | POA: Diagnosis not present

## 2020-05-25 DIAGNOSIS — R339 Retention of urine, unspecified: Secondary | ICD-10-CM | POA: Diagnosis not present

## 2020-05-25 DIAGNOSIS — I10 Essential (primary) hypertension: Secondary | ICD-10-CM | POA: Insufficient documentation

## 2020-05-25 LAB — URINALYSIS, COMPLETE (UACMP) WITH MICROSCOPIC
Bilirubin Urine: NEGATIVE
Glucose, UA: NEGATIVE mg/dL
Hgb urine dipstick: NEGATIVE
Ketones, ur: NEGATIVE mg/dL
Leukocytes,Ua: NEGATIVE
Nitrite: NEGATIVE
Protein, ur: NEGATIVE mg/dL
Specific Gravity, Urine: 1.02 (ref 1.005–1.030)
pH: 6.5 (ref 5.0–8.0)

## 2020-05-25 LAB — BASIC METABOLIC PANEL
Anion gap: 9 (ref 5–15)
BUN: 24 mg/dL — ABNORMAL HIGH (ref 8–23)
CO2: 21 mmol/L — ABNORMAL LOW (ref 22–32)
Calcium: 9 mg/dL (ref 8.9–10.3)
Chloride: 106 mmol/L (ref 98–111)
Creatinine, Ser: 0.82 mg/dL (ref 0.61–1.24)
GFR, Estimated: >60 mL/min (ref >60)
Glucose, Bld: 123 mg/dL — ABNORMAL HIGH (ref 70–99)
Potassium: 3.7 mmol/L (ref 3.5–5.1)
Sodium: 136 mmol/L (ref 135–145)

## 2020-05-25 LAB — RESP PANEL BY RT-PCR (FLU A&B, COVID) ARPGX2
Influenza A by PCR: NEGATIVE
Influenza B by PCR: NEGATIVE
SARS Coronavirus 2 by RT PCR: NEGATIVE

## 2020-05-25 LAB — CK: Total CK: 203 U/L (ref 49–397)

## 2020-05-25 LAB — CBC
HCT: 40 % (ref 39.0–52.0)
Hemoglobin: 13.3 g/dL (ref 13.0–17.0)
MCH: 30.3 pg (ref 26.0–34.0)
MCHC: 33.3 g/dL (ref 30.0–36.0)
MCV: 91.1 fL (ref 80.0–100.0)
Platelets: 190 10*3/uL (ref 150–400)
RBC: 4.39 MIL/uL (ref 4.22–5.81)
RDW: 13.2 % (ref 11.5–15.5)
WBC: 5.9 10*3/uL (ref 4.0–10.5)
nRBC: 0 % (ref 0.0–0.2)

## 2020-05-25 LAB — HEPATIC FUNCTION PANEL
ALT: 17 U/L (ref 0–44)
AST: 25 U/L (ref 15–41)
Albumin: 3.8 g/dL (ref 3.5–5.0)
Alkaline Phosphatase: 99 U/L (ref 38–126)
Bilirubin, Direct: 0.2 mg/dL (ref 0.0–0.2)
Indirect Bilirubin: 0.6 mg/dL (ref 0.3–0.9)
Total Bilirubin: 0.8 mg/dL (ref 0.3–1.2)
Total Protein: 7.6 g/dL (ref 6.5–8.1)

## 2020-05-25 LAB — LIPASE, BLOOD: Lipase: 32 U/L (ref 11–51)

## 2020-05-25 LAB — T4, FREE: Free T4: 0.85 ng/dL (ref 0.61–1.12)

## 2020-05-25 LAB — TSH: TSH: 3.275 u[IU]/mL (ref 0.350–4.500)

## 2020-05-25 LAB — TROPONIN I (HIGH SENSITIVITY): Troponin I (High Sensitivity): 9 ng/L (ref <18)

## 2020-05-25 MED ORDER — IOHEXOL 300 MG/ML  SOLN
100.0000 mL | Freq: Once | INTRAMUSCULAR | Status: AC | PRN
  Administered 2020-05-25: 100 mL via INTRAVENOUS

## 2020-05-25 NOTE — Discharge Instructions (Signed)
Foley catheter was placed for urinary retention.  You should call the urology number to schedule a follow-up to further discuss this.  Leave the Foley catheter in until you follow-up with him.  Return to the ER for fevers or any other concerns   1. Bilateral renal cysts.  2. Small nonobstructive left renal calculus. No hydronephrosis or  renal obstruction is noted.  3. No other abnormality seen in the abdomen or pelvis.

## 2020-05-25 NOTE — ED Provider Notes (Signed)
[facility] Emergency Department Provider Note  ____________________________________________   Event Date/Time   First MD Initiated Contact with Patient 05/25/20 1517     (approximate)  I have reviewed the triage vital signs and the nursing notes.   HISTORY  Chief Complaint Weakness    HPI Randall Schneider is a 87 y.o. male with hypertension, hyper lipidemia, arthritis who comes in for weakness.  According to wife patient was found down on the ground next to the bed.  She thinks that he just slid out of bed but patient does not remember falling.  Unclear how long he was on the ground floor.  She was able to get him back up but then he had another fall later on.  She reports some generalized weakness, constant, nothing makes it better, nothing makes it worse.  He also reports some pain in his lower abdomen.  Denies any chest pain, shortness of breath, cough.  On review of records patient was seen by Dr. Melrose Nakayama on 3/9 for moderate Alzheimer's dementia with difficulty walking and tremor.  They plan to send a referral for in-home physical therapy.          Past Medical History:  Diagnosis Date  . Arthritis   . HOH (hard of hearing)   . Hypercholesteremia   . Hypertension     There are no problems to display for this patient.   Past Surgical History:  Procedure Laterality Date  . CATARACT EXTRACTION W/PHACO Right 08/06/2018   Procedure: CATARACT EXTRACTION PHACO AND INTRAOCULAR LENS PLACEMENT ([place]) RIGHT DIABETES;  Surgeon: Leandrew Koyanagi, MD;  Location: [facility];  Service: Ophthalmology;  Laterality: Right;  . CIRCUMCISION, NON-NEWBORN  05/07/2013  . HERNIA REPAIR    . TONSILLECTOMY     and adenoids  . TRANSURETHRAL RESECTION OF PROSTATE  05/07/2013    Prior to Admission medications   Medication Sig Start Date End Date Taking? Authorizing Provider  aspirin 81 MG tablet Take 81 mg by mouth daily.    [provider]   atorvastatin (LIPITOR) 10 MG tablet Take 10 mg by mouth daily.    [provider]  donepezil (ARICEPT) 10 MG tablet Take 10 mg by mouth daily.    [provider]  doxazosin (CARDURA) 4 MG tablet Take 4 mg by mouth daily.    [provider]  FLUoxetine (PROZAC) 10 MG tablet Take 10 mg by mouth daily.    [provider]  hydrochlorothiazide (HYDRODIURIL) 12.5 MG tablet Take 12.5 mg by mouth daily.    [provider]  Multiple Vitamins-Minerals (PRESERVISION AREDS 2 PO) Take by mouth daily.    [provider]    Allergies Sulfa antibiotics  No family history on file.  Social History Social History   Tobacco Use  . Smoking status: Former Smoker    Quit date: 1990    Years since quitting: 32.2  . Smokeless tobacco: Never Used  Vaping Use  . Vaping Use: Never used  Substance Use Topics  . Alcohol use: Yes    Comment: rare  . Drug use: Never      Review of Systems Constitutional: No fever/chills, weakness, fall Eyes: No visual changes. ENT: No sore throat. Cardiovascular: Denies chest pain. Respiratory: Denies shortness of breath. Gastrointestinal: Positive lower abdominal pain, no nausea, vomiting Genitourinary: Negative for dysuria. Musculoskeletal: Negative for back pain. Skin: Negative for rash. Neurological: Negative for headaches, focal weakness or numbness. All other ROS negative ____________________________________________   PHYSICAL  EXAM:  VITAL SIGNS: ED Triage Vitals  Enc Vitals Group     BP 05/25/20 1407 (!) 160/73     Pulse Rate 05/25/20 1407 73     Resp 05/25/20 1407 16     Temp 05/25/20 1407 97.7 F (36.5 C)     Temp Source 05/25/20 1407 Oral     SpO2 05/25/20 1407 96 %     Weight 05/25/20 1421 200 lb (90.7 kg)     Height 05/25/20 1421 5\' 6"  (1.676 m)     Head Circumference --      Peak Flow --      Pain Score 05/25/20 1421 5     Pain Loc --      Pain Edu? --      Excl. in [place]? --      Constitutional: Alert and oriented. Well appearing and in no acute distress. Eyes: Conjunctivae are normal. EOMI. Head: Atraumatic. Nose: No congestion/rhinnorhea. Mouth/Throat: Mucous membranes are moist.   Neck: No stridor. Trachea Midline. FROM.  No C-spine tenderness Cardiovascular: Normal rate, regular rhythm. Grossly normal heart sounds.  Good peripheral circulation. Respiratory: Normal respiratory effort.  No retractions. Lungs CTAB. Gastrointestinal: Soft but tenderness in the lower abdomen. No distention. No abdominal bruits.  Musculoskeletal: No lower extremity tenderness nor edema.  No joint effusions.  Able to lift both legs up off the bed without any tenderness.  Good strength in his hands bilaterally with no tenderness Neurologic:  Normal speech and language. No gross focal neurologic deficits are appreciated.  Skin:  Skin is warm, dry and intact. No rash noted. Psychiatric: Mood and affect are normal. Speech and behavior are normal. GU: Deferred   ____________________________________________   LABS (all labs ordered are listed, but only abnormal results are displayed)  Labs Reviewed  BASIC METABOLIC PANEL - Abnormal; Notable for the following components:      Result Value   CO2 21 (*)    Glucose, Bld 123 (*)    BUN 24 (*)    All other components within normal limits  URINALYSIS, COMPLETE (UACMP) WITH MICROSCOPIC - Abnormal; Notable for the following components:   Bacteria, UA RARE (*)    All other components within normal limits  RESP PANEL BY RT-PCR (FLU A&B, COVID) ARPGX2  CBC  TSH  T4, FREE  HEPATIC FUNCTION PANEL  LIPASE, BLOOD  CK  CBG MONITORING, ED  TROPONIN I (HIGH SENSITIVITY)   ____________________________________________   ED ECG REPORT I, Vanessa Agua Fria, the attending physician, personally viewed and interpreted this ECG.  Normal sinus rate of 62, no ST elevation, no T wave inversions, normal  intervals ____________________________________________  RADIOLOGY   Official radiology report(s): CT Head Wo Contrast  Result Date: 05/25/2020 CLINICAL DATA:  Generalized weakness history of fall EXAM: CT HEAD WITHOUT CONTRAST TECHNIQUE: Contiguous axial images were obtained from the base of the skull through the vertex without intravenous contrast. COMPARISON:  Report 10/23/2002 FINDINGS: Brain: No acute territorial infarction, hemorrhage, or intracranial mass. Advanced atrophy. Moderate hypodensity in the white matter consistent with chronic small vessel ischemic change. Slightly enlarged ventricles felt secondary to atrophy. Vascular: No hyperdense vessels.  Carotid vascular calcification Skull: Normal. Negative for fracture or focal lesion. Sinuses/Orbits: Fluid in the left mastoid air cells. Completely opacified paranasal sinuses with slightly hyperdense secretions. Other: None IMPRESSION: 1. No CT evidence for acute intracranial abnormality. 2. Atrophy and chronic small vessel ischemic changes of the white matter. 3. Pansinusitis 4. Fluid in the left mastoid air cells.  Electronically Signed   By: Donavan Foil M.D.   On: 05/25/2020 18:15   CT ABDOMEN PELVIS W CONTRAST  Result Date: 05/25/2020 CLINICAL DATA:  Lower abdominal pain and distention. EXAM: CT ABDOMEN AND PELVIS WITH CONTRAST TECHNIQUE: Multidetector CT imaging of the abdomen and pelvis was performed using the standard protocol following bolus administration of intravenous contrast. CONTRAST:  16mL OMNIPAQUE IOHEXOL 300 MG/ML  SOLN COMPARISON:  None. FINDINGS: Lower chest: No acute abnormality. Hepatobiliary: No focal liver abnormality is seen. No gallstones, gallbladder wall thickening, or biliary dilatation. Pancreas: Unremarkable. No pancreatic ductal dilatation or surrounding inflammatory changes. Spleen: Normal in size without focal abnormality. Adrenals/Urinary Tract: Adrenal glands appear normal. Bilateral renal cysts are noted.  Small nonobstructive left renal calculus is noted. No hydronephrosis or renal obstruction is noted. Urinary bladder is decompressed secondary to Foley catheter. Stomach/Bowel: The stomach appears normal. There is no evidence of bowel obstruction or inflammation. Vascular/Lymphatic: Aortic atherosclerosis. No enlarged abdominal or pelvic lymph nodes. Reproductive: Prostate is unremarkable. Other: No abdominal wall hernia or abnormality. No abdominopelvic ascites. Musculoskeletal: No acute or significant osseous findings. IMPRESSION: 1. Bilateral renal cysts. 2. Small nonobstructive left renal calculus. No hydronephrosis or renal obstruction is noted. 3. No other abnormality seen in the abdomen or pelvis. Aortic Atherosclerosis (ICD10-I70.0). Electronically Signed   By: Marijo Conception M.D.   On: 05/25/2020 18:20    ____________________________________________   PROCEDURES  Procedure(s) performed (including Critical Care):  Procedures   ____________________________________________   INITIAL IMPRESSION / ASSESSMENT AND PLAN / ED COURSE  KY RUMPLE was evaluated in Emergency Department on 05/25/2020 for the symptoms described in the history of present illness. He was evaluated in the context of the global COVID-19 pandemic, which necessitated consideration that the patient might be at risk for infection with the SARS-CoV-2 virus that causes COVID-19. Institutional protocols and algorithms that pertain to the evaluation of patients at risk for COVID-19 are in a state of rapid change based on information released by regulatory bodies including the CDC and federal and state organizations. These policies and algorithms were followed during the patient's care in the ED.     Patient is an 87 year old male who comes in with generalized weakness and 2 falls.  Labs ordered to evaluate for Electra abnormalities, AKI, CT head evaluate for intercranial hemorrhage and CT abdomen to evaluate for appendicitis,  diverticulitis or other acute abdominal pathology.  CK ordered to evaluate for rhabdo.  Labs are reassuring.  No evidence of anemia, rhabdo, low suspicion for ACS  CT head and CT abdomen were negative.  Patient's bladder scan showed a significant of urine.  Patient had a Foley placed with initially 1 L out and clamped and then additional 500 out.  Patient remained hemodynamically stable after Foley was placed.  Patient was given urology's number for follow-up and instructed on how to use the Foley catheter.  Family expressed understanding.  No signs of UTI.  Patient was able to ambulate and felt much stronger and felt much better and comfortable with discharge home      ____________________________________________   FINAL CLINICAL IMPRESSION(S) / ED DIAGNOSES   Final diagnoses:  Urinary retention  Weakness      MEDICATIONS GIVEN DURING THIS VISIT:  Medications  iohexol (OMNIPAQUE) 300 MG/ML solution 100 mL (100 mLs Intravenous Contrast Given 05/25/20 1748)     ED Discharge Orders    None       Note:  This document was prepared using Dragon voice recognition  software and may include unintentional dictation errors.   Vanessa Jenera, MD 05/25/20 [phone]

## 2020-05-25 NOTE — ED Triage Notes (Signed)
Pt comes with c/o increased weakness today. Family states she found the pt on the floor twice today. Pt denies falling. Pt states some groin pain.   Pt states pain when urination all the time.  Pt states mid lower  Abdominal pain. Pt states some nausea.

## 2020-05-25 NOTE — ED Triage Notes (Signed)
Pt comes into the ED via EMS from home with c/o generalized weakness today , states he fell today and has a strong urine smell, slid out of his recliner and was not able to get back up. Normally ambulates well with a walker. Pt c/o lower abd pain with frequent urination.  150/83 97%RA 71HR 97.9 temp [id number]

## 2020-05-25 NOTE — ED Notes (Signed)
Pt presents to ED with c/o of having multiple falls today. Family at bedside is also concerned about pt not voiding at all this morning and states he is wearing the same brief as he had on last night, lower ABD is painful on palpation and slightly extended at this time. On arrival to room pt had a wet brief on that was leaking through, pt cleaned up and new brief applied. Pt denies taking blood thinners at this time.   MD requests bladder scan, result of 1000 ml, MD aware and MD requests indwelling at this time. 1100 of clear yellow urine drained into drainage bag at this time. Pt states lower ABD pain "is so much better."  Pt states HX of prostate issues, easy insertion of cathter by this RN. Pt is A&Ox4. Pt denies chest pain or SOB. Pt denies N/V/D. Pt denies chest pain.

## 2020-05-25 NOTE — ED Notes (Signed)
Pt at CT

## 2020-05-25 NOTE — ED Notes (Signed)
D/C and f/up discussed with pt.

## 2020-05-27 ENCOUNTER — Emergency Department: Payer: Medicare Other

## 2020-05-27 ENCOUNTER — Other Ambulatory Visit: Payer: Self-pay

## 2020-05-27 ENCOUNTER — Encounter: Payer: Self-pay | Admitting: Emergency Medicine

## 2020-05-27 ENCOUNTER — Emergency Department
Admission: EM | Admit: 2020-05-27 | Discharge: 2020-05-30 | Disposition: A | Discharge status: Home / Self Care | Payer: Medicare Other | Attending: Emergency Medicine | Admitting: Emergency Medicine

## 2020-05-27 DIAGNOSIS — R338 Other retention of urine: Secondary | ICD-10-CM

## 2020-05-27 DIAGNOSIS — Y92009 Unspecified place in unspecified non-institutional (private) residence as the place of occurrence of the external cause: Secondary | ICD-10-CM | POA: Diagnosis not present

## 2020-05-27 DIAGNOSIS — I10 Essential (primary) hypertension: Secondary | ICD-10-CM | POA: Diagnosis not present

## 2020-05-27 DIAGNOSIS — R339 Retention of urine, unspecified: Secondary | ICD-10-CM | POA: Diagnosis not present

## 2020-05-27 DIAGNOSIS — F0390 Unspecified dementia without behavioral disturbance: Secondary | ICD-10-CM | POA: Insufficient documentation

## 2020-05-27 DIAGNOSIS — W19XXXA Unspecified fall, initial encounter: Secondary | ICD-10-CM | POA: Diagnosis not present

## 2020-05-27 DIAGNOSIS — Z87891 Personal history of nicotine dependence: Secondary | ICD-10-CM | POA: Diagnosis not present

## 2020-05-27 DIAGNOSIS — Z20822 Contact with and (suspected) exposure to covid-19: Secondary | ICD-10-CM | POA: Diagnosis not present

## 2020-05-27 DIAGNOSIS — Y9301 Activity, walking, marching and hiking: Secondary | ICD-10-CM | POA: Diagnosis not present

## 2020-05-27 DIAGNOSIS — Z7982 Long term (current) use of aspirin: Secondary | ICD-10-CM | POA: Insufficient documentation

## 2020-05-27 DIAGNOSIS — R251 Tremor, unspecified: Secondary | ICD-10-CM | POA: Diagnosis not present

## 2020-05-27 DIAGNOSIS — Z79899 Other long term (current) drug therapy: Secondary | ICD-10-CM | POA: Diagnosis not present

## 2020-05-27 LAB — URINALYSIS, COMPLETE (UACMP) WITH MICROSCOPIC
Bilirubin Urine: NEGATIVE
Glucose, UA: NEGATIVE mg/dL
Ketones, ur: 5 mg/dL — AB
Leukocytes,Ua: NEGATIVE
Nitrite: NEGATIVE
Protein, ur: 30 mg/dL — AB
RBC / HPF: >50 RBC/hpf — ABNORMAL HIGH (ref 0–5)
Specific Gravity, Urine: 1.017 (ref 1.005–1.030)
Squamous Epithelial / HPF: NONE SEEN (ref 0–5)
pH: 6 (ref 5.0–8.0)

## 2020-05-27 MED ORDER — MEMANTINE HCL 5 MG PO TABS
5.0000 mg | ORAL_TABLET | Freq: Two times a day (BID) | ORAL | Status: DC
  Administered 2020-05-28: 5 mg via ORAL
  Filled 2020-05-27 (×2): qty 1

## 2020-05-27 MED ORDER — ASPIRIN EC 81 MG PO TBEC
81.0000 mg | DELAYED_RELEASE_TABLET | Freq: Every day | ORAL | Status: DC
  Administered 2020-05-28 – 2020-05-30 (×3): 81 mg via ORAL
  Filled 2020-05-27 (×3): qty 1

## 2020-05-27 MED ORDER — DOXAZOSIN MESYLATE 2 MG PO TABS
2.0000 mg | ORAL_TABLET | Freq: Every day | ORAL | Status: DC
  Administered 2020-05-28 – 2020-05-29 (×2): 2 mg via ORAL
  Filled 2020-05-27 (×4): qty 1

## 2020-05-27 MED ORDER — DONEPEZIL HCL 5 MG PO TABS
10.0000 mg | ORAL_TABLET | Freq: Every day | ORAL | Status: DC
  Administered 2020-05-28 – 2020-05-30 (×3): 10 mg via ORAL
  Filled 2020-05-27 (×3): qty 2

## 2020-05-27 MED ORDER — ATORVASTATIN CALCIUM 20 MG PO TABS
10.0000 mg | ORAL_TABLET | Freq: Every day | ORAL | Status: DC
  Administered 2020-05-28 – 2020-05-30 (×3): 10 mg via ORAL
  Filled 2020-05-27 (×3): qty 1

## 2020-05-27 MED ORDER — FLUOXETINE HCL 10 MG PO CAPS
10.0000 mg | ORAL_CAPSULE | Freq: Every day | ORAL | Status: DC
  Administered 2020-05-28 – 2020-05-30 (×3): 10 mg via ORAL
  Filled 2020-05-27 (×4): qty 1

## 2020-05-27 NOTE — ED Notes (Signed)
Patient awaiting med verification by pharmacy tech for reordering of home medications.

## 2020-05-27 NOTE — ED Notes (Signed)
Wife reports odd behavior from husband. Patient is alert, oriented to baseline with an NIHSS of 0. Wife reports "he's usually up quickly and able to feed himself, and now he's making me feed him, its weird." Patient denies any pain or discomfort. Wife is noted to be feeding patient by hand. Patient able to swallow without difficulty.

## 2020-05-27 NOTE — ED Notes (Signed)
Patient now appears to be sleeping again. Patient respirations even and unlabored. NADN.

## 2020-05-27 NOTE — ED Notes (Signed)
Patient meal tray taken to patient's room. Patient and wife at bedside appear to be sleeping. Meal tray left at bedside for patient to eat upon waking.

## 2020-05-27 NOTE — ED Notes (Signed)
Patient and wife at bedside updated on POC. Patient denies needs or complaints.

## 2020-05-27 NOTE — Evaluation (Signed)
Physical Therapy Evaluation Patient Details Name: Randall Schneider MRN: [id number] DOB: 1930-04-05 Today's Date: 05/27/2020   History of Present Illness  presented to ER secondary to generalized/progressive weakness, two falls in previous 48 hours, and persistent urinary frequency/burning (s/p foley change out in ER)  Clinical Impression  Patient sleeping soundly upon arrival to session, but awakens to voice, mod tactile stimulation.  Oriented to self only; does follow simple commands, pleasant and cooperative. However, marked deficits in STM, task sequencing and problem-solving/sequencing noted (per family, acutely worsened from baseline).  Generally weak and deconditioned throughout all extremities; no focal weakness appreciated.  Generally rigid throughout trunk with limited ability to actively dissociate extremities/trunk.  Currently requiring mod/max assist for bed mobility; min assist for sit/stand with RW; mod assist for standing balance and short-distance gait (5' forward/backward/laterally) with RW.  Demonstrates forward flexed posture with downward gaze, very short shuffling steps; maintains RW arms length anterior to patient, mod cuing/assist for walker position and management.  Limited ability to maintain bilat hip/knee flexion in closed-chain/standing position; constant cuing for postural extension throughout.  Very high fall risk; unsafe to attempt without RW and +1, hands-on assist at all times. Would benefit from skilled PT to address above deficits and promote optimal return to PLOF.; recommend transition to STR upon discharge from acute hospitalization.     Follow Up Recommendations SNF    Equipment Recommendations       Recommendations for Other Services       Precautions / Restrictions Precautions Precautions: Fall Restrictions Weight Bearing Restrictions: No      Mobility  Bed Mobility Overal bed mobility: Needs Assistance Bed Mobility: Supine to Sit;Sit to Supine      Supine to sit: Mod assist Sit to supine: Max assist   General bed mobility comments: poor dissociation of trunk and extremities; marked difficulty coordinating and sequencing mobility efforts    Transfers Overall transfer level: Needs assistance Equipment used: Rolling walker (2 wheeled) Transfers: Sit to/from Stand Sit to Stand: Min assist         General transfer comment: step by step cuing for hand placement, safety awareness; often utilizes posterior surface of bilat LEs against edge of bed to stabilize  Ambulation/Gait Ambulation/Gait assistance: Mod assist Gait Distance (Feet):  (5' x2 forward, backward, lateral) Assistive device: Rolling walker (2 wheeled)       General Gait Details: forward flexed posture with downward gaze, very short shuffling steps; maintains RW arms length anterior to patient, mod cuing/assist for walker position and management.  Limited ability to maintain bilat hip/knee flexion in closed-chain/standing position; constant cuing for postural extension throughout  Stairs            Wheelchair Mobility    Modified Rankin (Stroke Patients Only)       Balance Overall balance assessment: Needs assistance Sitting-balance support: No upper extremity supported;Feet supported Sitting balance-Leahy Scale: Fair Sitting balance - Comments: mild forward trunk lean, limited ability to actively mobilize outside immediate BOS; generally rigid trunk positioning/posturing   Standing balance support: Bilateral upper extremity supported Standing balance-Leahy Scale: Poor                               Pertinent Vitals/Pain Pain Assessment: No/denies pain    Home Living Family/patient expects to be discharged to:: Private residence Living Arrangements: Spouse/significant other Available Help at Discharge: Family;Available 24 hours/day Type of Home: House Home Access: Stairs to enter Entrance Stairs-Rails: Right;Left  Entrance  Stairs-Number of Steps: 4 Home Layout: One level Home Equipment: Walker - 4 wheels      Prior Function Level of Independence: Independent with assistive device(s)         Comments: Sup/mod indep for limited household distances with 4WRW (does "forget" walker at times); multiple fall history reported, requiring physical assist for recovery.     Hand Dominance   Dominant Hand: Right    Extremity/Trunk Assessment   Upper Extremity Assessment Upper Extremity Assessment: Overall WFL for tasks assessed    Lower Extremity Assessment Lower Extremity Assessment: Generalized weakness (grossly 4-/5 throughout)       Communication   Communication: HOH  Cognition Arousal/Alertness: Lethargic Behavior During Therapy: WFL for tasks assessed/performed Overall Cognitive Status: Impaired/Different from baseline                                 General Comments: Oriented to self only; follows simple commands. Does demonstrate notable deficits in STM, sequencing and overall problem-solving, often requiring step-by-step verbal cuing for mobility sequencing and safety awareness      General Comments      Exercises Other Exercises Other Exercises: Sit/stand x4 with RW, min assist; step by step cuing for hand placement and safety awareness.  Decreased recall of information between trials. Other Exercises: Forward/backward/lateral stepping at bedside, mod assist with RW-constant cuing for increased step height/length; poor balance reactions noted. Unsafe to attempt without RW and constant hands-on assist due to fall risk.   Assessment/Plan    PT Assessment Patient needs continued PT services  PT Problem List Decreased strength;Decreased activity tolerance;Decreased balance;Decreased mobility;Decreased coordination;Decreased cognition;Decreased knowledge of use of DME;Decreased safety awareness;Decreased knowledge of precautions;Cardiopulmonary status limiting activity        PT Treatment Interventions DME instruction;Gait training;Functional mobility training;Stair training;Therapeutic activities;Therapeutic exercise;Balance training;Patient/family education    PT Goals (Current goals can be found in the Care Plan section)  Acute Rehab PT Goals Patient Stated Goal: per family, to get therapy and to consider rehab PT Goal Formulation: With patient/family Time For Goal Achievement: 06/10/20 Potential to Achieve Goals: Good    Frequency Min 2X/week   Barriers to discharge        Co-evaluation               AM-PAC PT "6 Clicks" Mobility  Outcome Measure Help needed turning from your back to your side while in a flat bed without using bedrails?: A Little Help needed moving from lying on your back to sitting on the side of a flat bed without using bedrails?: A Lot Help needed moving to and from a bed to a chair (including a wheelchair)?: A Lot Help needed standing up from a chair using your arms (e.g., wheelchair or bedside chair)?: A Little Help needed to walk in hospital room?: A Lot Help needed climbing 3-5 steps with a railing? : A Lot 6 Click Score: 14    End of Session Equipment Utilized During Treatment: Gait belt Activity Tolerance: Patient tolerated treatment well Patient left: in bed;with call bell/phone within reach;with family/visitor present Nurse Communication: Mobility status PT Visit Diagnosis: Repeated falls (R29.6);Muscle weakness (generalized) (M62.81);Difficulty in walking, not elsewhere classified (R26.2)    Time: 1342-1420 PT Time Calculation (min) (ACUTE ONLY): 38 min   Charges:   PT Evaluation $PT Eval Moderate Complexity: 1 Mod PT Treatments $Therapeutic Activity: 8-22 mins        Jaylynn Mcaleer H. Owens Shark, PT,  DPT, NCS 05/27/20, 2:36 PM [phone]

## 2020-05-27 NOTE — ED Notes (Signed)
PT in to evaluate  Family remains at bedside

## 2020-05-27 NOTE — ED Triage Notes (Signed)
Presents via EMS from home s/p fall   Per EMS he fell times 2 yesterday  Pulled on foley cath  Complaining of pain at penis  Blood noted in foley bag

## 2020-05-27 NOTE — ED Notes (Addendum)
Blood noted in foley with clots Foley irrigated with 20 mls sterile water times 2 with clots returning Provider aware Complaining that he is still burning

## 2020-05-27 NOTE — ED Notes (Signed)
Resting at present  Family remains at bedside

## 2020-05-27 NOTE — ED Notes (Addendum)
Patient pericare provided. Patient also provided new bed linens, and incontinence pads. Patient repositioned and turned on his side for sleep. Patient wife at bedside. Patient denies needs at this time.

## 2020-05-27 NOTE — ED Notes (Signed)
Foley replaced times 2  With 16 F and 65F   Pt tolerated well  No urine return   Pt was incont of large amt of urine prior to last foley being placed

## 2020-05-27 NOTE — ED Notes (Signed)
Patient provided extra pillows, warm blankets, and the call light. Family at bedside provided recliner, pillow, and blanket. Patient is alert, oriented to baseline, with no complaints. Patient denies pain. Lights dimmed for patient comfort.

## 2020-05-27 NOTE — ED Notes (Signed)
Patient is resting comfortably. 

## 2020-05-27 NOTE — ED Notes (Signed)
Meagan made aware of need for med verification with pharmacy.

## 2020-05-27 NOTE — TOC Initial Note (Signed)
Transition of Care [facility]) - Initial/Assessment Note    Patient Details  Name: Randall Schneider MRN: [id number] Date of Birth: [birth year]  [facility]) CM/SW Contact:    Ova Freshwater Phone Number: [phone] 05/27/2020, 4:29 PM  Clinical Narrative:                  Patient presents to [facility] ED due to multiple falls at home.  CSW spoke with patient's spouse Akil Hoos [phone] and son Roswell Miners [phone] both at bedside. CSW explained the role of TOC patient care, and estimated timeline and process for SNF placement. CSW gave the family the Medicare.gov website. Family verbalized understanding of SNF placement process.       Barriers to Discharge: SNF Pending bed offer,ED SNF auth   Patient Goals and CMS Choice   CMS Medicare.gov Compare Post Acute Care list provided to:: Other (Comment Required) Choice offered to / list presented to : Spouse,Adult Children  Expected Discharge Plan and Services                                                Prior Living Arrangements/Services                       Activities of Daily Living      Permission Sought/Granted                  Emotional Assessment              Admission diagnosis:  fall ems There are no problems to display for this patient.  PCP:  Sofie Hartigan, MD Pharmacy:   [facility] [phone] [address], [place] [address] [place] [address] Phone: [phone] Fax: [phone]     Social Determinants of Health (SDOH) Interventions    Readmission Risk Interventions No flowsheet data found.

## 2020-05-27 NOTE — ED Provider Notes (Signed)
-----------------------------------------   11:27 PM on 05/27/2020 -----------------------------------------  I received word that this patient is currently awaiting social work consultation for placement; reportedly he is not safe to go home and needs additional care.  Pharmacy has not yet had the opportunity to reconcile his home meds, but I was able to find a note from his neurologist, Dr. Melrose Nakayama, which was in the system for about 1 week ago which he listed his home medications as the following:  - aspirin 81 MG EC tablet Take 81 mg by mouth once daily  . atorvastatin (LIPITOR) 10 MG tablet Take 1 tablet (10 mg total) by mouth once daily  . donepeziL (ARICEPT) 10 MG tablet Take 1 tablet (10 mg total) by mouth nightly  . Namenda 5 mg PO BID . doxazosin (CARDURA) 2 MG tablet TAKE 1 TABLET(2 MG) BY MOUTH EVERY NIGHT  . FLUoxetine (PROZAC) 10 MG capsule TAKE 1 CAPSULE(10 MG) BY MOUTH EVERY DAY   I ordered these medications and the patient is awaiting transition of care placement assistance.   Hinda Kehr, MD 05/27/20 2329

## 2020-05-27 NOTE — ED Notes (Signed)
Dr. Jari Pigg (physician on A side at this time) notified of wife's concerns.

## 2020-05-27 NOTE — ED Provider Notes (Signed)
[facility] Emergency Department Provider Note   ____________________________________________   Event Date/Time   First MD Initiated Contact with Patient 05/27/20 [phone]     (approximate)  I have reviewed the triage vital signs and the nursing notes.   HISTORY via EMS and wife secondary to patient dementia.  Chief Complaint Fall and foley replacement    HPI Randall Schneider is a 87 y.o. male patient arrived via EMS secondary to a fall at home.  Wife and EMS state patient has repetitive falls secondary to dementia and tremors when walking.  Patient was seen 2 days ago for same complaint.  Patient has a referral for in-home physical therapy.  Wife states no LOC or head injury with this fall.  Patient also has a Foley catheter which the wife said he repeatedly pulls on and believe he might had partially dislodged the catheter this morning.  There is dried blood around the meatus and bloody urine. Patient had CT of the abdomen 2 days ago showing bilateral renal cysts.  Small nonobstructive left kidney stones.  There is no hydronephrosis or other obstructions.  Patient also had head CT no acute intracranial modalities.         Past Medical History:  Diagnosis Date  . Arthritis   . HOH (hard of hearing)   . Hypercholesteremia   . Hypertension     There are no problems to display for this patient.   Past Surgical History:  Procedure Laterality Date  . CATARACT EXTRACTION W/PHACO Right 08/06/2018   Procedure: CATARACT EXTRACTION PHACO AND INTRAOCULAR LENS PLACEMENT ([place]) RIGHT DIABETES;  Surgeon: Leandrew Koyanagi, MD;  Location: [place];  Service: Ophthalmology;  Laterality: Right;  . CIRCUMCISION, NON-NEWBORN  05/07/2013  . HERNIA REPAIR    . TONSILLECTOMY     and adenoids  . TRANSURETHRAL RESECTION OF PROSTATE  05/07/2013    Prior to Admission medications   Medication Sig Start Date End Date Taking? Authorizing Provider  aspirin 81 MG  tablet Take 81 mg by mouth daily.    [provider]  atorvastatin (LIPITOR) 10 MG tablet Take 10 mg by mouth daily.    [provider]  donepezil (ARICEPT) 10 MG tablet Take 10 mg by mouth daily.    [provider]  doxazosin (CARDURA) 4 MG tablet Take 4 mg by mouth daily.    [provider]  FLUoxetine (PROZAC) 10 MG tablet Take 10 mg by mouth daily.    [provider]  hydrochlorothiazide (HYDRODIURIL) 12.5 MG tablet Take 12.5 mg by mouth daily.    [provider]  Multiple Vitamins-Minerals (PRESERVISION AREDS 2 PO) Take by mouth daily.    [provider]    Allergies Sulfa antibiotics  No family history on file.  Social History Social History   Tobacco Use  . Smoking status: Former Smoker    Quit date: 1990    Years since quitting: 32.2  . Smokeless tobacco: Never Used  Vaping Use  . Vaping Use: Never used  Substance Use Topics  . Alcohol use: Yes    Comment: rare  . Drug use: Never    Review of Systems  Constitutional: No fever/chills.  No acute distress.  Generalized weakness. Eyes: No visual changes. ENT: No sore throat. Cardiovascular: Denies chest pain. Respiratory: Denies shortness of breath. Gastrointestinal: Lower abdominal pain.  No nausea, no vomiting.  No diarrhea.  No constipation. Genitourinary: Hematuria.  Urinary retention.. Musculoskeletal: Negative for back pain. Skin: Negative  for rash. Neurological: Negative for headaches, focal weakness or numbness. 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 Endocrine:  Hyperlipidemia and hypertension. Allergic/Immunilogical: Sulfur antibiotics  ____________________________________________   PHYSICAL EXAM:  VITAL SIGNS: ED Triage Vitals  Enc Vitals Group     BP 05/27/20 0731 (!) 165/78     Pulse Rate 05/27/20 0731 76     Resp 05/27/20 0731 18     Temp 05/27/20 0731 98.2 F (36.8 C)     Temp Source 05/27/20 0731 Oral     SpO2  05/27/20 0731 96 %     Weight 05/27/20 0729 200 lb 9.9 oz (91 kg)     Height 05/27/20 0729 5\' 7"  (1.702 m)     Head Circumference --      Peak Flow --      Pain Score 05/27/20 0729 3     Pain Loc --      Pain Edu? --      Excl. in [place]? --     Constitutional: Alert and oriented. Well appearing and in no acute distress. Eyes: Conjunctivae are normal. PERRL. EOMI. Head: Atraumatic. Nose: No congestion/rhinnorhea. Mouth/Throat: Mucous membranes are moist.  Oropharynx non-erythematous. Neck: No stridor.  No cervical spine tenderness to palpation. Cardiovascular: Normal rate, regular rhythm. Grossly normal heart sounds.  Good peripheral circulation.  Abated blood pressure Respiratory: Normal respiratory effort.  No retractions. Lungs CTAB. Gastrointestinal: Soft and nontender. No distention. No abdominal bruits. No CVA tenderness. Genitourinary: Clot noticed in back of catheter.  Dry blood at meatus. Musculoskeletal: No lower extremity tenderness nor edema.  No joint effusions. Neurologic:  Normal speech and language. No gross focal neurologic deficits are appreciated.  Skin:  Skin is warm, dry and intact. No rash noted. Psychiatric: Mood and affect are normal. Speech and behavior are normal.  ____________________________________________   LABS (all labs ordered are listed, but only abnormal results are displayed)  Labs Reviewed  URINALYSIS, COMPLETE (UACMP) WITH MICROSCOPIC - Abnormal; Notable for the following components:      Result Value   Color, Urine YELLOW (*)    APPearance HAZY (*)    Hgb urine dipstick LARGE (*)    Ketones, ur 5 (*)    Protein, ur 30 (*)    RBC / HPF >50 (*)    Bacteria, UA RARE (*)    All other components within normal limits  URINE CULTURE   ____________________________________________  EKG   ____________________________________________  RADIOLOGY I, Sable Feil, personally viewed and evaluated these images (plain radiographs) as part of my  medical decision making, as well as reviewing the written report by the radiologist.  ED MD interpretation: Official radiology report(s): US Pelvis Limited  Result Date: 05/27/2020 CLINICAL DATA:  Repeat Foley catheter insertion. EXAM: LIMITED ULTRASOUND OF PELVIS TECHNIQUE: Limited transabdominal ultrasound examination of the pelvis was performed. COMPARISON:  Pelvic ultrasound 05/27/2020 at 0923 hours FINDINGS: The urinary bladder has a normal appearance for the degree of distention. No Foley catheter is visualized within the bladder. Bladder volume: 1,005 mL Other findings:  None. IMPRESSION: No Foley catheter visualized within the bladder. Unchanged bladder distension. Electronically Signed   By: Logan Bores M.D.   On: 05/27/2020 11:55   US Pelvis Limited  Result Date: 05/27/2020 CLINICAL DATA:  Evaluate bladder.  Blood clot noted Foley catheter. EXAM: LIMITED ULTRASOUND OF PELVIS TECHNIQUE: Limited transabdominal ultrasound examination of the pelvis was performed. COMPARISON:  None. FINDINGS: The urinary bladder has a normal appearance for the degree of distention. No  focal abnormality noted. Additionally, no Foley catheter identified within the lumen of the bladder. Pre-void volume: 1058.38 cc Post-void volume: Patient unable to void. Other findings:  None. IMPRESSION: 1. Distended urinary bladder with a volume of 1058 cc. No focal bladder abnormality identified to explain clots in Foley catheter. 2. Foley catheter not visualized within the lumen of the bladder. Recommend confirmation of catheter placement. Electronically Signed   By: Kerby Moors M.D.   On: 05/27/2020 09:56    ____________________________________________   PROCEDURES  Procedure(s) performed (including Critical Care):  Procedures   ____________________________________________   INITIAL IMPRESSION / ASSESSMENT AND PLAN / ED COURSE  As part of my medical decision making, I reviewed the following data within the  [place]         Patient presents for evaluation of repetitive falls at home.  Patient also was found to have urinary retention and a Foley catheter was placed 2 days ago.  Patient continues to tug at the catheter secondary to his demented state.  Attempts to reinstate catheter using 16-gauge catheter or an accessible.  Discussed patient with urologist who recommend ultrasound and was found that the 16-gauge catheter was not in the bladder.  Urologist recommend using 18-gauge catheter  with success.  Patient evaluated by PT and will be held for rehab.      ____________________________________________   FINAL CLINICAL IMPRESSION(S) / ED DIAGNOSES  Final diagnoses:  Fall, initial encounter  Acute urinary retention     ED Discharge Orders    None      *Please note:  KINNIE KAUPP was evaluated in Emergency Department on 05/27/2020 for the symptoms described in the history of present illness. He was evaluated in the context of the global COVID-19 pandemic, which necessitated consideration that the patient might be at risk for infection with the SARS-CoV-2 virus that causes COVID-19. Institutional protocols and algorithms that pertain to the evaluation of patients at risk for COVID-19 are in a state of rapid change based on information released by regulatory bodies including the CDC and federal and state organizations. These policies and algorithms were followed during the patient's care in the ED.  Some ED evaluations and interventions may be delayed as a result of limited staffing during and the pandemic.*   Note:  This document was prepared using Dragon voice recognition software and may include unintentional dictation errors.    Sable Feil, PA-C 05/27/20 1535    Carrie Mew, MD 05/27/20 [phone]

## 2020-05-28 LAB — RESP PANEL BY RT-PCR (FLU A&B, COVID) ARPGX2
Influenza A by PCR: NEGATIVE
Influenza B by PCR: NEGATIVE
SARS Coronavirus 2 by RT PCR: NEGATIVE

## 2020-05-28 NOTE — TOC Progression Note (Signed)
Transition of Care [facility]) - Progression Note    Patient Details  Name: Randall Schneider MRN: [id number] Date of Birth: 1930/08/06  Transition of Care [facility]) CM/SW [place], [place] Phone Number: [phone] 05/28/2020, 8:21 AM  Clinical Narrative:     SNF search started.  No [place]. PASRR# [id number]     Barriers to Discharge: SNF Pending bed offer,ED SNF auth  Expected Discharge Plan and Services                                                 Social Determinants of Health (SDOH) Interventions    Readmission Risk Interventions No flowsheet data found.

## 2020-05-28 NOTE — ED Notes (Signed)
Pt assisted back into bed, wander guard was alarming. Pt states he would like to know where he is and what time it is. Pt informed of place and time. Pt denies other needs.

## 2020-05-28 NOTE — ED Notes (Signed)
Pt pulled up and adjusted in bed.  Foley bag emptied at this time.  Pt resting comfortably in bed, chest rise even and unlabored.

## 2020-05-28 NOTE — ED Notes (Signed)
Report received from [employer]

## 2020-05-28 NOTE — NC FL2 (Signed)
[place] LEVEL OF CARE SCREENING TOOL     IDENTIFICATION  Patient Name: Randall Schneider Birthdate: October 31, 1930 Sex: male Admission Date (Current Location): 05/27/2020  [place] and [place] Number:  Engineering geologist and Address:  [facility], [address], [place], [place] [address]      Provider Number: [id number]  Attending Physician Name and Address:  No att. providers found  Relative Name and Phone Number:  KNOXX, BOEDING (Spouse)   [phone]    Current Level of Care: Hospital Recommended Level of Care: [place] Prior Approval Number:    Date Approved/Denied:   PASRR Number: [id number]  Discharge Plan: SNF    Current Diagnoses: There are no problems to display for this patient.   Orientation RESPIRATION BLADDER Height & Weight     Self,Situation,Place  Normal Incontinent Weight: 200 lb 9.9 oz (91 kg) Height:  5\' 7"  (170.2 cm)  BEHAVIORAL SYMPTOMS/MOOD NEUROLOGICAL BOWEL NUTRITION STATUS      Incontinent Diet  AMBULATORY STATUS COMMUNICATION OF NEEDS Skin   Limited Assist Verbally Normal                       Personal Care Assistance Level of Assistance  Bathing,Feeding,Dressing,Total care Bathing Assistance: Limited assistance Feeding assistance: Independent Dressing Assistance: Limited assistance Total Care Assistance: Limited assistance   Functional Limitations Info  Speech,Hearing,Sight Sight Info: Adequate Hearing Info: Adequate Speech Info: Adequate    SPECIAL CARE FACTORS FREQUENCY       PT 5X per week OT 5X per week                Contractures Contractures Info: Not present    Additional Factors Info                  Current Medications (05/28/2020):  This is the current hospital active medication list Current Facility-Administered Medications  Medication Dose Route Frequency Provider Last Rate Last Admin  . aspirin EC tablet 81 mg  81 mg Oral Daily Hinda Kehr, MD      . atorvastatin (LIPITOR) tablet 10 mg  10 mg Oral Daily Hinda Kehr, MD      . donepezil (ARICEPT) tablet 10 mg  10 mg Oral Daily Hinda Kehr, MD      . doxazosin (CARDURA) tablet 2 mg  2 mg Oral QHS Hinda Kehr, MD      . FLUoxetine (PROZAC) capsule 10 mg  10 mg Oral Daily Hinda Kehr, MD      . memantine [facility]) tablet 5 mg  5 mg Oral BID Hinda Kehr, MD       Current Outpatient Medications  Medication Sig Dispense Refill  . aspirin 81 MG tablet Take 81 mg by mouth daily.    Marland Kitchen atorvastatin (LIPITOR) 10 MG tablet Take 10 mg by mouth daily.    Marland Kitchen donepezil (ARICEPT) 10 MG tablet Take 10 mg by mouth daily.    Marland Kitchen doxazosin (CARDURA) 2 MG tablet Take 2 mg by mouth at bedtime.    Marland Kitchen FLUoxetine (PROZAC) 10 MG tablet Take 10 mg by mouth daily.    . memantine (NAMENDA) 5 MG tablet Take 5 mg by mouth 2 (two) times daily.    . hydrochlorothiazide (HYDRODIURIL) 12.5 MG tablet Take 12.5 mg by mouth daily. (Patient not taking: Reported on 05/28/2020)    . Multiple Vitamins-Minerals (PRESERVISION AREDS 2 PO) Take by mouth daily.       Discharge Medications: Please see  discharge summary for a list of discharge medications.  Relevant Imaging Results:  Relevant Lab Results:   Additional Information SS# [id number]  Adelene Amas, LCSWA

## 2020-05-28 NOTE — ED Provider Notes (Signed)
Emergency Medicine Observation Re-evaluation Note  Randall Schneider is a 87 y.o. male, seen on rounds today.  Pt initially presented to the ED for complaints of Fall and Foley Replacement Currently, the patient is resting calmly, denies any acute complaints.  Physical Exam  BP 131/81   Pulse 65   Temp 98.2 F (36.8 C) (Oral)   Resp 16   Ht 5\' 7"  (1.702 m)   Wt 91 kg   SpO2 96%   BMI 31.42 kg/m  Physical Exam General: calm Cardiac: normal rate Lungs: equal chest rise Psych: calm  ED Course / MDM  EKG:   I have reviewed the labs performed to date as well as medications administered while in observation.  Recent changes in the last 24 hours include none.  Plan  Current plan is for social work dispo. Patient is not under full IVC at this time.   Lucrezia Starch, MD 05/28/20 2159

## 2020-05-29 LAB — URINE CULTURE: Culture: NO GROWTH

## 2020-05-29 NOTE — ED Provider Notes (Signed)
Emergency Medicine Observation Re-evaluation Note  Randall Schneider is a 87 y.o. male, seen on rounds today.  Pt initially presented to the ED for complaints of Fall and Foley Replacement Currently, the patient is resting without distress, awaiting disposition plan from social work team.  Physical Exam  BP (!) 171/82 (BP Location: Right Arm)   Pulse 62   Temp 97.8 F (36.6 C) (Oral)   Resp 18   Ht 5\' 7"  (1.702 m)   Wt 91 kg   SpO2 95%   BMI 31.42 kg/m  Physical Exam General: Patient resting calm, having eaten meal this evening no distress Cardiac: Well-perfused, warm Lungs: Clear, even unlabored.  No distress Psych: Resting calm without distress  ED Course / MDM  EKG:   I have reviewed the labs performed to date as well as medications administered while in observation.  Recent changes in the last 24 hours include further disposition planning for transition of care team.  Plan  Current plan is for social work for disposition planning. Patient is not under full IVC at this time.   Delman Kitten, MD 05/29/20 [phone]

## 2020-05-29 NOTE — ED Notes (Signed)
Pt rounds completed at this time.  Pt noted to have blood around urethral meatus from foley catheter.  Blood cleaned off legs and peri care performed at this time.  New Foley anchor placed on pt at this time. Pt has no other needs at this time.

## 2020-05-29 NOTE — TOC Progression Note (Signed)
Transition of Care [facility]) - Progression Note    Patient Details  Name: TAMARIO HEAL MRN: [id number] Date of Birth: 1930-04-02  Transition of Care [facility]) CM/SW [place], LCSW Phone Number: 05/29/2020, 10:37 AM  Clinical Narrative:   CSW met with the patient's wife and the patient at bedside to discussed bed offers.  CSW reiterated the process of admission into a SNF and next steps.  Discussed that once a facility makes an offer CSW accepts the bed offer based on the facility that is chosen pending insurance authorization.   CSW will wait for family to review SNF bed offers below.   Bed offers received as of 3/20/l2022:  [facility]  [facility] Preferred SNF  [facility] SN[facility]  [place] Preferred SNF    Collateral:  Avanell Shackleton, wife,  [phone] [place], son, [phone]    Barriers to Discharge: SNF Pending bed offer,ED SNF auth  Expected Discharge Plan and Services             Social Determinants of Health (SDOH) Interventions    Readmission Risk Interventions No flowsheet data found.

## 2020-05-29 NOTE — ED Notes (Signed)
Meliton Rattan, rn to assume care of pt at this time.

## 2020-05-29 NOTE — ED Notes (Signed)
Pt sleeping soundly, meal tray at bedside, even

## 2020-05-29 NOTE — ED Notes (Signed)
Pt resting in bed with eyes closed at this time.  Even chest rise and fall noted.  Will continue to monitor.

## 2020-05-29 NOTE — ED Notes (Signed)
Pt positioned on right side, pulled up, foley bloody on legs cleaned and reattached, emptied, fluids given

## 2020-05-29 NOTE — ED Notes (Signed)
Pt resting, resps unlabored. Bed in low and locked position with siderails up and call bell in place. Non skid yellow socks on pt. [place] fall alarm in place.

## 2020-05-29 NOTE — ED Notes (Signed)
Patient believed that he was cleaning a ceiling. Reoriented patient. Emptied foley bag. Patient denies pain and is resting comfortably.

## 2020-05-30 NOTE — ED Notes (Addendum)
Given breakfast; family at bedside. Pt clean and dry.

## 2020-05-30 NOTE — ED Notes (Signed)
Pt given fluids and rolled onto backside

## 2020-05-30 NOTE — Progress Notes (Signed)
Physical Therapy Treatment Patient Details Name: Randall Schneider MRN: [id number] DOB: 1930-10-13 Today's Date: 05/30/2020    History of Present Illness presented to ER secondary to generalized/progressive weakness, two falls in previous 48 hours, and persistent urinary frequency/burning (s/p foley change out in ER)    PT Comments    Ready for session.  Wife in room.  To EOB with mod a x 1 despite good effort.  Most assist to bring trunk upright.  Once sitting, generally steady. Stands with min guard/mod a during session.  Varied with attempt and surface.  Participated in exercises as described below.  He is able to sidestep left and right along bed along with 30' then 86' in room with turns.  Taken to 1A to PT gym for stair training. Up with min guard and B rails, down with min assist and significant hesitancy on last step and turning back to chair.  HR did increase to high 120's with gait. Remained in recliner after session.  Wife in room.    Discussed at length with wife regarding discharge plan.  She is concerned about general weakness and falls at home and overall struggling for the past couple weeks.  He is progressing with mobility but struggles with bed mobility and descending stairs.  He continues to need +1 assist at all times.  At this point she and son prefer SNF for discharge in hopes of increasing overall strength and safety at home.  She will discuss with son today given some improvement in mobility.  If they decide to discharge home a commode, gait belt and possibly a wheelchair would be appropriate equipment needs to make discharge home successful.   Patient suffers from progressive weakness which impairs his/her ability to perform daily activities like toileting, feeding, dressing, grooming, bathing in the home. A cane, walker, crutch will not resolve the patient's issue with performing activities of daily living. A lightweight wheelchair and cushion is required/recommended and will  allow patient to safely perform daily activities.   Patient can safely propel the wheelchair in the home or has a caregiver who can provide assistance.    Follow Up Recommendations  SNF;Other (comment)     Equipment Recommendations  3in1 (PT);Wheelchair (measurements PT);Other (comment) (gait belt)    Recommendations for Other Services       Precautions / Restrictions Precautions Precautions: Fall Restrictions Weight Bearing Restrictions: No    Mobility  Bed Mobility Overal bed mobility: Needs Assistance Bed Mobility: Supine to Sit     Supine to sit: Mod assist          Transfers Overall transfer level: Needs assistance Equipment used: Rolling walker (2 wheeled) Transfers: Sit to/from Stand Sit to Stand: Min assist;Mod assist         General transfer comment: step by step cuing for hand placement, safety awareness; often utilizes posterior surface of bilat LEs against edge of bed to stabilize  Ambulation/Gait Ambulation/Gait assistance: Min assist Gait Distance (Feet): 50 Feet Assistive device: Rolling walker (2 wheeled) Gait Pattern/deviations: Step-through pattern;Decreased step length - right;Decreased step length - left Gait velocity: decreased       Stairs Stairs: Yes Stairs assistance: Min assist Stair Management: Two rails;Forwards;Step to pattern Number of Stairs: 4 General stair comments: up with ease, some hesitancy coming down   Wheelchair Mobility    Modified Rankin (Stroke Patients Only)       Balance Overall balance assessment: Needs assistance Sitting-balance support: No upper extremity supported;Feet supported Sitting balance-Leahy Scale: Good  Standing balance support: Bilateral upper extremity supported Standing balance-Leahy Scale: Poor Standing balance comment: heavy reliance on RW                            Cognition Arousal/Alertness: Awake/alert Behavior During Therapy: WFL for tasks  assessed/performed Overall Cognitive Status: Within Functional Limits for tasks assessed                                        Exercises Other Exercises Other Exercises: standing marching in place and SLR x 5 BLE    General Comments        Pertinent Vitals/Pain Pain Assessment: No/denies pain    Home Living                      Prior Function            PT Goals (current goals can now be found in the care plan section) Progress towards PT goals: Progressing toward goals    Frequency    Min 2X/week      PT Plan Current plan remains appropriate    Co-evaluation              AM-PAC PT "6 Clicks" Mobility   Outcome Measure  Help needed turning from your back to your side while in a flat bed without using bedrails?: A Little Help needed moving from lying on your back to sitting on the side of a flat bed without using bedrails?: A Lot Help needed moving to and from a bed to a chair (including a wheelchair)?: A Little Help needed standing up from a chair using your arms (e.g., wheelchair or bedside chair)?: A Little Help needed to walk in hospital room?: A Little Help needed climbing 3-5 steps with a railing? : A Lot 6 Click Score: 16    End of Session Equipment Utilized During Treatment: Gait belt Activity Tolerance: Patient tolerated treatment well Patient left: with call bell/phone within reach;with family/visitor present;in chair Nurse Communication: Mobility status PT Visit Diagnosis: Repeated falls (R29.6);Muscle weakness (generalized) (M62.81);Difficulty in walking, not elsewhere classified (R26.2)     Time: 2863-8177 PT Time Calculation (min) (ACUTE ONLY): 41 min  Charges:  $Gait Training: 23-37 mins $Therapeutic Activity: 8-22 mins                    Chesley Noon, PTA 05/30/20, 9:41 AM

## 2020-05-30 NOTE — TOC Transition Note (Signed)
Transition of Care [facility]) - CM/SW Discharge Note   Patient Details  Name: Randall Schneider MRN: [id number] Date of Birth: 04/15/1930  [facility] of [facility]) CM/SW Contact:  Ova Freshwater Phone Number: [phone] 05/30/2020, 2:39 PM   Clinical Narrative:     Patient will d/c to [facility], Room 403-A, report # [phone].  Patient's spouse Jafar Poffenberger [phone] will provide transportation, ETA 4:00PM. CSW updated Ebony Hail at [facility] about transportation. EDP/ED Staff updated.    Barriers to Discharge: SNF Pending bed offer,ED SNF auth   Patient Goals and CMS Choice   CMS Medicare.gov Compare Post Acute Care list provided to:: Other (Comment Required) Choice offered to / list presented to : [place]  Discharge Placement                       Discharge Plan and Services                                     Social Determinants of Health (SDOH) Interventions     Readmission Risk Interventions No flowsheet data found.

## 2020-05-30 NOTE — TOC Progression Note (Signed)
Transition of Care [facility]) - Progression Note    Patient Details  Name: Randall Schneider MRN: [id number] Date of Birth: 17-Jan-1931  Transition of Care [facility]) CM/SW [place], [place] Phone Number: [phone] 05/30/2020, 12:45 PM  Clinical Narrative:     CSW spoke with patient's wife Devaris Quirk [phone], and she chose [facility]  CSW spoke with Ebony Hail at [facility] and she said she would begin the insurance authorization and contact me with an update.    Barriers to Discharge: SNF Pending bed offer,ED SNF auth  Expected Discharge Plan and Services                                                 Social Determinants of Health (SDOH) Interventions    Readmission Risk Interventions No flowsheet data found.

## 2020-05-30 NOTE — ED Notes (Signed)
New sheets provided. Sitting in recliner, wife at bedside currently.

## 2020-06-01 ENCOUNTER — Ambulatory Visit: Payer: Self-pay | Admitting: Urology

## 2020-06-13 ENCOUNTER — Ambulatory Visit (INDEPENDENT_AMBULATORY_CARE_PROVIDER_SITE_OTHER): Payer: Medicare Other | Admitting: Physician Assistant

## 2020-06-13 ENCOUNTER — Ambulatory Visit: Payer: Medicare Other | Admitting: Urology

## 2020-06-13 ENCOUNTER — Encounter: Payer: Self-pay | Admitting: Urology

## 2020-06-13 ENCOUNTER — Other Ambulatory Visit: Payer: Self-pay

## 2020-06-13 VITALS — BP 104/69 | HR 99 | Ht 69.0 in | Wt 200.0 lb

## 2020-06-13 DIAGNOSIS — N400 Enlarged prostate without lower urinary tract symptoms: Secondary | ICD-10-CM

## 2020-06-13 DIAGNOSIS — R339 Retention of urine, unspecified: Secondary | ICD-10-CM

## 2020-06-13 LAB — BLADDER SCAN AMB NON-IMAGING

## 2020-06-13 NOTE — Progress Notes (Signed)
Afternoon follow-up  Patient returned to clinic this afternoon for repeat PVR. He reports drinking approximately 24oz of fluid. It is unclear if he has been able to urinate as he has urinary incontinence at baseline and wears absorbent underwear; he had an episode of stool incontinence after his visit this morning and has been changed.  PVR 36mL.  Results for orders placed or performed in visit on 06/13/20  BLADDER SCAN AMB NON-IMAGING  Result Value Ref Range   Scan Result 0 mL    Voiding trial passed. Counseled patient to follow up with cysto as recommended by Dr. Bernardo Heater in 1 month, sooner if he develops the inability to urinate, lower abdominal pain, or abdominal distention.  Follow up: 1 month cysto with Dr. Bernardo Heater

## 2020-06-13 NOTE — Progress Notes (Signed)
06/13/2020 9:35 AM   Randall Schneider 11-28-1930 [id number]  Referring provider: Sofie Hartigan, MD [address] [place],  [place] [address]  Chief Complaint  Patient presents with  . Urinary Retention    HPI: Randall Schneider is an 87 y.o. male who presents for ED follow-up of urinary retention.  He presents today with his wife.   Presented to ED 05/25/2020 with complaints of weakness.  His wife apparently found him on the floor next to the bed  History of repetitive falls secondary to dementia  He had CT of the abdomen pelvis which incidentally showed a distended bladder and Foley catheter was placed with return of 1500 mL of urine  Prior history of an outlet procedure at [facility] >10 years ago which sounds like a TURP  No bothersome LUTS prior to his episode of retention; his wife states they had a difficult time placing a catheter in the ED  Med list includes a low-dose of doxazosin at 2 mg nightly   PMH: Past Medical History:  Diagnosis Date  . Arthritis   . HOH (hard of hearing)   . Hypercholesteremia   . Hypertension     Surgical History: Past Surgical History:  Procedure Laterality Date  . CATARACT EXTRACTION W/PHACO Right 08/06/2018   Procedure: CATARACT EXTRACTION PHACO AND INTRAOCULAR LENS PLACEMENT ([place]) RIGHT DIABETES;  Surgeon: Randall Koyanagi, MD;  Location: [facility];  Service: Ophthalmology;  Laterality: Right;  . CIRCUMCISION, NON-NEWBORN  05/07/2013  . HERNIA REPAIR    . TONSILLECTOMY     and adenoids  . TRANSURETHRAL RESECTION OF PROSTATE  05/07/2013    Home Medications:  Allergies as of 06/13/2020      Reactions   Sulfa Antibiotics    Unknown reaction "long time ago" Other reaction(s): Unknown Other reaction(s): UNKNOWN Unknown, happened when he was a child      Medication List       Accurate as of June 13, 2020  9:35 AM. If you have any questions, ask your nurse or doctor.        STOP taking these medications    hydrochlorothiazide 12.5 MG tablet Commonly known as: HYDRODIURIL Stopped by: Abbie Sons, MD   memantine 5 MG tablet Commonly known as: NAMENDA Stopped by: Abbie Sons, MD     TAKE these medications   aspirin 81 MG tablet Take 81 mg by mouth daily.   atorvastatin 10 MG tablet Commonly known as: LIPITOR Take 10 mg by mouth daily.   donepezil 10 MG tablet Commonly known as: ARICEPT Take 10 mg by mouth daily.   doxazosin 2 MG tablet Commonly known as: CARDURA Take 2 mg by mouth at bedtime.   FLUoxetine 10 MG tablet Commonly known as: PROZAC Take 10 mg by mouth daily.   PRESERVISION AREDS 2 PO Take by mouth daily.       Allergies:  Allergies  Allergen Reactions  . Sulfa Antibiotics     Unknown reaction "long time ago" Other reaction(s): Unknown Other reaction(s): UNKNOWN Unknown, happened when he was a child     Family History: History reviewed. No pertinent family history.  Social History:  reports that he quit smoking about 32 years ago. He has never used smokeless tobacco. He reports current alcohol use. He reports that he does not use drugs.   Physical Exam: BP 104/69   Pulse 99   Ht 5\' 9"  (1.753 m)   Wt 200 lb (90.7 kg)   BMI 29.53  kg/m   Constitutional:  Alert, No acute distress. HEENT: [place] AT, moist mucus membranes.  Trachea midline, no masses. Cardiovascular: No clubbing, cyanosis, or edema. Respiratory: Normal respiratory effort, no increased work of breathing. GI: Abdomen is soft, nontender, nondistended, no abdominal masses GU: Foley catheter draining clear urine   Assessment & Plan:    1.  Urinary retention  Catheter removed for voiding trial  He has a PA follow-up scheduled this afternoon for bladder scan  History of difficult catheter placement and probable prior TURP.  Schedule cystoscopy ~ 1 month to evaluate for stricture/bladder neck contracture  DRE at that visit   Abbie Sons, [place] [address] [place], [place] [address] [phone]

## 2020-06-13 NOTE — Progress Notes (Signed)
Catheter Removal  Patient is present today for a catheter removal.  81ml of water was drained from the balloon. A 16FR foley cath was removed from the bladder no complications were noted . Patient tolerated well.  Performed by: Gaspar Cola CMA

## 2020-06-13 NOTE — Progress Notes (Signed)
Error

## 2020-06-15 ENCOUNTER — Emergency Department: Payer: Medicare Other

## 2020-06-15 ENCOUNTER — Observation Stay
Admission: EM | Admit: 2020-06-15 | Discharge: 2020-06-17 | Disposition: A | Discharge status: Home / Self Care | Payer: Medicare Other | Attending: Internal Medicine | Admitting: Internal Medicine

## 2020-06-15 ENCOUNTER — Other Ambulatory Visit: Payer: Self-pay

## 2020-06-15 DIAGNOSIS — R339 Retention of urine, unspecified: Secondary | ICD-10-CM | POA: Insufficient documentation

## 2020-06-15 DIAGNOSIS — F32A Depression, unspecified: Secondary | ICD-10-CM | POA: Diagnosis not present

## 2020-06-15 DIAGNOSIS — R531 Weakness: Secondary | ICD-10-CM | POA: Diagnosis not present

## 2020-06-15 DIAGNOSIS — Z87891 Personal history of nicotine dependence: Secondary | ICD-10-CM | POA: Diagnosis not present

## 2020-06-15 DIAGNOSIS — E785 Hyperlipidemia, unspecified: Secondary | ICD-10-CM | POA: Diagnosis not present

## 2020-06-15 DIAGNOSIS — Z79899 Other long term (current) drug therapy: Secondary | ICD-10-CM | POA: Insufficient documentation

## 2020-06-15 DIAGNOSIS — W06XXXA Fall from bed, initial encounter: Secondary | ICD-10-CM | POA: Insufficient documentation

## 2020-06-15 DIAGNOSIS — R262 Difficulty in walking, not elsewhere classified: Secondary | ICD-10-CM | POA: Diagnosis not present

## 2020-06-15 DIAGNOSIS — Z7982 Long term (current) use of aspirin: Secondary | ICD-10-CM | POA: Insufficient documentation

## 2020-06-15 DIAGNOSIS — Z20822 Contact with and (suspected) exposure to covid-19: Secondary | ICD-10-CM | POA: Insufficient documentation

## 2020-06-15 DIAGNOSIS — N320 Bladder-neck obstruction: Secondary | ICD-10-CM

## 2020-06-15 DIAGNOSIS — R2689 Other abnormalities of gait and mobility: Secondary | ICD-10-CM | POA: Insufficient documentation

## 2020-06-15 DIAGNOSIS — N3000 Acute cystitis without hematuria: Secondary | ICD-10-CM

## 2020-06-15 DIAGNOSIS — F0390 Unspecified dementia without behavioral disturbance: Secondary | ICD-10-CM | POA: Diagnosis not present

## 2020-06-15 DIAGNOSIS — I10 Essential (primary) hypertension: Secondary | ICD-10-CM | POA: Diagnosis not present

## 2020-06-15 DIAGNOSIS — N390 Urinary tract infection, site not specified: Secondary | ICD-10-CM | POA: Diagnosis not present

## 2020-06-15 LAB — URINALYSIS, COMPLETE (UACMP) WITH MICROSCOPIC
Bacteria, UA: NONE SEEN
Bilirubin Urine: NEGATIVE
Glucose, UA: NEGATIVE mg/dL
Ketones, ur: 5 mg/dL — AB
Nitrite: POSITIVE — AB
Protein, ur: 100 mg/dL — AB
Specific Gravity, Urine: 1.017 (ref 1.005–1.030)
Squamous Epithelial / HPF: NONE SEEN (ref 0–5)
WBC, UA: >50 WBC/hpf — ABNORMAL HIGH (ref 0–5)
pH: 6 (ref 5.0–8.0)

## 2020-06-15 LAB — RESP PANEL BY RT-PCR (FLU A&B, COVID) ARPGX2
Influenza A by PCR: NEGATIVE
Influenza B by PCR: NEGATIVE
SARS Coronavirus 2 by RT PCR: NEGATIVE

## 2020-06-15 LAB — CBC WITH DIFFERENTIAL/PLATELET
Abs Immature Granulocytes: 0.06 10*3/uL (ref 0.00–0.07)
Basophils Absolute: 0.1 10*3/uL (ref 0.0–0.1)
Basophils Relative: 1 %
Eosinophils Absolute: 0.1 10*3/uL (ref 0.0–0.5)
Eosinophils Relative: 0 %
HCT: 38.1 % — ABNORMAL LOW (ref 39.0–52.0)
Hemoglobin: 12.9 g/dL — ABNORMAL LOW (ref 13.0–17.0)
Immature Granulocytes: 0 %
Lymphocytes Relative: 9 %
Lymphs Abs: 1.2 10*3/uL (ref 0.7–4.0)
MCH: 30.6 pg (ref 26.0–34.0)
MCHC: 33.9 g/dL (ref 30.0–36.0)
MCV: 90.3 fL (ref 80.0–100.0)
Monocytes Absolute: 0.9 10*3/uL (ref 0.1–1.0)
Monocytes Relative: 7 %
Neutro Abs: 11.7 10*3/uL — ABNORMAL HIGH (ref 1.7–7.7)
Neutrophils Relative %: 83 %
Platelets: 210 10*3/uL (ref 150–400)
RBC: 4.22 MIL/uL (ref 4.22–5.81)
RDW: 13 % (ref 11.5–15.5)
WBC: 14.1 10*3/uL — ABNORMAL HIGH (ref 4.0–10.5)
nRBC: 0 % (ref 0.0–0.2)

## 2020-06-15 LAB — COMPREHENSIVE METABOLIC PANEL
ALT: 17 U/L (ref 0–44)
AST: 20 U/L (ref 15–41)
Albumin: 3.3 g/dL — ABNORMAL LOW (ref 3.5–5.0)
Alkaline Phosphatase: 105 U/L (ref 38–126)
Anion gap: 9 (ref 5–15)
BUN: 20 mg/dL (ref 8–23)
CO2: 23 mmol/L (ref 22–32)
Calcium: 8.9 mg/dL (ref 8.9–10.3)
Chloride: 106 mmol/L (ref 98–111)
Creatinine, Ser: 1.01 mg/dL (ref 0.61–1.24)
GFR, Estimated: >60 mL/min (ref >60)
Glucose, Bld: 119 mg/dL — ABNORMAL HIGH (ref 70–99)
Potassium: 4 mmol/L (ref 3.5–5.1)
Sodium: 138 mmol/L (ref 135–145)
Total Bilirubin: 1.1 mg/dL (ref 0.3–1.2)
Total Protein: 7.3 g/dL (ref 6.5–8.1)

## 2020-06-15 LAB — TSH: TSH: 4.335 u[IU]/mL (ref 0.350–4.500)

## 2020-06-15 LAB — AMMONIA: Ammonia: 12 umol/L (ref 9–35)

## 2020-06-15 MED ORDER — ATORVASTATIN CALCIUM 10 MG PO TABS
10.0000 mg | ORAL_TABLET | Freq: Every day | ORAL | Status: DC
  Administered 2020-06-16 – 2020-06-17 (×2): 10 mg via ORAL
  Filled 2020-06-15 (×2): qty 1

## 2020-06-15 MED ORDER — ACETAMINOPHEN 325 MG PO TABS: 650.0000 mg | ORAL_TABLET | Freq: Four times a day (QID) | ORAL | Status: DC | PRN

## 2020-06-15 MED ORDER — ACETAMINOPHEN 500 MG PO TABS
1000.0000 mg | ORAL_TABLET | Freq: Once | ORAL | Status: AC
  Administered 2020-06-15: 1000 mg via ORAL
  Filled 2020-06-15: qty 2

## 2020-06-15 MED ORDER — ACETAMINOPHEN 650 MG RE SUPP: 650.0000 mg | Freq: Four times a day (QID) | RECTAL | Status: DC | PRN

## 2020-06-15 MED ORDER — LACTATED RINGERS IV BOLUS
500.0000 mL | Freq: Once | INTRAVENOUS | Status: AC
  Administered 2020-06-15: 500 mL via INTRAVENOUS

## 2020-06-15 MED ORDER — ONDANSETRON HCL 4 MG/2ML IJ SOLN: 4.0000 mg | Freq: Four times a day (QID) | INTRAMUSCULAR | Status: DC | PRN

## 2020-06-15 MED ORDER — DOXAZOSIN MESYLATE 2 MG PO TABS
2.0000 mg | ORAL_TABLET | Freq: Every day | ORAL | Status: DC
  Administered 2020-06-16 (×2): 2 mg via ORAL
  Filled 2020-06-15 (×4): qty 1

## 2020-06-15 MED ORDER — ASPIRIN EC 81 MG PO TBEC
81.0000 mg | DELAYED_RELEASE_TABLET | Freq: Every day | ORAL | Status: DC
  Administered 2020-06-16 – 2020-06-17 (×2): 81 mg via ORAL
  Filled 2020-06-15 (×2): qty 1

## 2020-06-15 MED ORDER — ONDANSETRON HCL 4 MG PO TABS: 4.0000 mg | ORAL_TABLET | Freq: Four times a day (QID) | ORAL | Status: DC | PRN

## 2020-06-15 MED ORDER — SODIUM CHLORIDE 0.9 % IV SOLN
1.0000 g | INTRAVENOUS | Status: DC
  Administered 2020-06-16 – 2020-06-17 (×2): 1 g via INTRAVENOUS
  Filled 2020-06-15: qty 1
  Filled 2020-06-15: qty 10

## 2020-06-15 MED ORDER — ENOXAPARIN SODIUM 40 MG/0.4ML ~~LOC~~ SOLN
40.0000 mg | Freq: Every day | SUBCUTANEOUS | Status: DC
  Administered 2020-06-16 (×2): 40 mg via SUBCUTANEOUS
  Filled 2020-06-15 (×2): qty 0.4

## 2020-06-15 MED ORDER — FLUOXETINE HCL 10 MG PO CAPS
10.0000 mg | ORAL_CAPSULE | Freq: Every day | ORAL | Status: DC
  Administered 2020-06-16 – 2020-06-17 (×2): 10 mg via ORAL
  Filled 2020-06-15 (×2): qty 1

## 2020-06-15 MED ORDER — DONEPEZIL HCL 5 MG PO TABS
10.0000 mg | ORAL_TABLET | Freq: Every day | ORAL | Status: DC
  Administered 2020-06-16 – 2020-06-17 (×2): 10 mg via ORAL
  Filled 2020-06-15 (×2): qty 2

## 2020-06-15 MED ORDER — SODIUM CHLORIDE 0.9 % IV SOLN
1.0000 g | Freq: Once | INTRAVENOUS | Status: AC
  Administered 2020-06-15: 1 g via INTRAVENOUS
  Filled 2020-06-15: qty 10

## 2020-06-15 NOTE — ED Triage Notes (Signed)
Here per EMS from home (lives with wife) wife called 911 on 3 different occasions today with complaints of patient having altered mental status and low grade fever for which she gave him tylenol today. Per ems, pt had a foley cath that was recently pulled and patient just got home 3 days ago from another facility. Pt has no complaints on initial evaluation.

## 2020-06-15 NOTE — ED Provider Notes (Signed)
[facility] Emergency Department Provider Note  ____________________________________________   Event Date/Time   First MD Initiated Contact with Patient 06/15/20 1932     (approximate)  I have reviewed the triage vital signs and the nursing notes.   HISTORY  Chief Complaint Altered Mental Status   HPI Randall Schneider is a 87 y.o. male + history of HTN, HDL, arthritis and dementia recently discharged from rehab who presents via EMS from home for assessment of some generalized weakness and falls and increased confusion from baseline.  On arrival patient states he "feels fine".  He is not sure why he is emergency room.  He is oriented to year and place but not recent events and denies any acute sick symptoms.  Specifically denies any pain cough fevers and is not sure if he has any recent falls.  Shortly after patient arrived in the ED his wife also arrived at bedside.  She does note that patient is currently living at home with her and she is his primary caregiver.  She states that over the last 24 hours he seemed much more weak and slightly more confused than usual and was complaining of some burning urination.  He was having significant difficulty with urinary tension last week and had to have Foley placed which was removed.  She states he has been falling out of bed several times twice today typically just slipping out of bed on his bottom.  She is not sure if he has had any fevers and does not think he has any cough vomiting does not know of any other acute sick symptoms.         Past Medical History:  Diagnosis Date  . Arthritis   . HOH (hard of hearing)   . Hypercholesteremia   . Hypertension     Patient Active Problem List   Diagnosis Date Noted  . Weakness 06/15/2020  . Essential hypertension 06/15/2020  . Dementia ([place]) 06/15/2020  . Acute lower UTI 06/15/2020  . Hyperlipidemia 06/15/2020    Past Surgical History:  Procedure Laterality Date  .  CATARACT EXTRACTION W/PHACO Right 08/06/2018   Procedure: CATARACT EXTRACTION PHACO AND INTRAOCULAR LENS PLACEMENT ([place]) RIGHT DIABETES;  Surgeon: Leandrew Koyanagi, MD;  Location: [facility];  Service: Ophthalmology;  Laterality: Right;  . CIRCUMCISION, NON-NEWBORN  05/07/2013  . HERNIA REPAIR    . TONSILLECTOMY     and adenoids  . TRANSURETHRAL RESECTION OF PROSTATE  05/07/2013    Prior to Admission medications   Medication Sig Start Date End Date Taking? Authorizing Provider  aspirin 81 MG tablet Take 81 mg by mouth daily.   Yes [provider]  atorvastatin (LIPITOR) 10 MG tablet Take 10 mg by mouth daily.   Yes [provider]  donepezil (ARICEPT) 10 MG tablet Take 10 mg by mouth daily.   Yes [provider]  doxazosin (CARDURA) 2 MG tablet Take 2 mg by mouth at bedtime. 03/28/20  Yes [provider]  FLUoxetine (PROZAC) 10 MG tablet Take 10 mg by mouth daily.   Yes [provider]  memantine (NAMENDA) 5 MG tablet Take 1 tablet by mouth 2 (two) times daily. Patient not taking: Reported on 06/15/2020 05/18/20 05/18/21  [provider]  Multiple Vitamins-Minerals (PRESERVISION AREDS 2 PO) Take by mouth daily. Patient not taking: Reported on 06/15/2020    [provider]    Allergies Sulfa antibiotics  No family history on file.  Social History Social History  Tobacco Use  . Smoking status: Former Smoker    Quit date: 1990    Years since quitting: 32.2  . Smokeless tobacco: Never Used  Vaping Use  . Vaping Use: Never used  Substance Use Topics  . Alcohol use: Yes    Comment: rare  . Drug use: Never    Review of Systems  Review of Systems  Unable to perform ROS: Dementia      ____________________________________________   PHYSICAL EXAM:  VITAL SIGNS: ED Triage Vitals  Enc Vitals Group     BP      Pulse      Resp      Temp      Temp src      SpO2      Weight      Height      Head  Circumference      Peak Flow      Pain Score      Pain Loc      Pain Edu?      Excl. in [place]?    Vitals:   06/15/20 1937 06/15/20 2108  BP: (!) 149/75 (!) 168/83  Pulse: 85 82  Resp: (!) 21 18  Temp: 99.8 F (37.7 C) 99.5 F (37.5 C)  SpO2: 95% 95%   Physical Exam Vitals and nursing note reviewed.  Constitutional:      Appearance: He is well-developed.  HENT:     Head: Normocephalic and atraumatic.     Right Ear: External ear normal.     Left Ear: External ear normal.     Nose: Nose normal.  Eyes:     Conjunctiva/sclera: Conjunctivae normal.  Cardiovascular:     Rate and Rhythm: Normal rate and regular rhythm.     Heart sounds: No murmur heard.   Pulmonary:     Effort: Pulmonary effort is normal. No respiratory distress.     Breath sounds: Normal breath sounds.  Abdominal:     Palpations: Abdomen is soft.     Tenderness: There is no abdominal tenderness.  Musculoskeletal:     Cervical back: Neck supple.  Skin:    General: Skin is warm and dry.  Neurological:     Mental Status: He is alert. He is disoriented and confused.     No tenderness step-offs deformities over the C/T/L-spine.  2+ bilateral radial pulses.  No obvious trauma to the face scalp head and neck chest or arms or legs.  Patient is otherwise nonfocal supine neuro exam with intact cranial nerves and full and symmetric strength and sensation to all extremities. ____________________________________________   LABS (all labs ordered are listed, but only abnormal results are displayed)  Labs Reviewed  CBC WITH DIFFERENTIAL/PLATELET - Abnormal; Notable for the following components:      Result Value   WBC 14.1 (*)    Hemoglobin 12.9 (*)    HCT 38.1 (*)    Neutro Abs 11.7 (*)    All other components within normal limits  COMPREHENSIVE METABOLIC PANEL - Abnormal; Notable for the following components:   Glucose, Bld 119 (*)    Albumin 3.3 (*)    All other components within normal limits  URINALYSIS,  COMPLETE (UACMP) WITH MICROSCOPIC - Abnormal; Notable for the following components:   Color, Urine YELLOW (*)    APPearance TURBID (*)    Hgb urine dipstick MODERATE (*)    Ketones, ur 5 (*)    Protein, ur 100 (*)    Nitrite POSITIVE (*)  Leukocytes,Ua MODERATE (*)    WBC, UA >50 (*)    All other components within normal limits  RESP PANEL BY RT-PCR (FLU A&B, COVID) ARPGX2  URINE CULTURE  TSH  AMMONIA  BASIC METABOLIC PANEL  CBC   ____________________________________________  EKG  Sinus rhythm with ventricular rate of 87, normal axis, unremarkable intervals and no clear evidence of acute ischemia or other significant Arrhythmia.  ____________________________________________  RADIOLOGY  ED MD interpretation: No acute injury or other acute process on CT head or C-spine or chest x-ray.   Official radiology report(s): DG Chest 2 View  Result Date: 06/15/2020 CLINICAL DATA:  Fall EXAM: CHEST - 2 VIEW COMPARISON:  None. FINDINGS: The heart size and mediastinal contours are within normal limits. Both lungs are clear. The visualized skeletal structures are unremarkable. IMPRESSION: No active cardiopulmonary disease. Electronically Signed   By: Prudencio Pair M.D.   On: 06/15/2020 20:25   CT Head Wo Contrast  Result Date: 06/15/2020 CLINICAL DATA:  Altered mental status EXAM: CT HEAD WITHOUT CONTRAST TECHNIQUE: Contiguous axial images were obtained from the base of the skull through the vertex without intravenous contrast. COMPARISON:  May 27, 2020 FINDINGS: Brain: No evidence of acute territorial infarction, hemorrhage, hydrocephalus,extra-axial collection or mass lesion/mass effect. There is dilatation the ventricles and sulci consistent with age-related atrophy. Low-attenuation changes in the deep white matter consistent with small vessel ischemia. Vascular: No hyperdense vessel or unexpected calcification. Skull: The skull is intact. No fracture or focal lesion identified.  Sinuses/Orbits: There is complete opacification of the bilateral maxillary sinuses, ethmoid air cells, sphenoid sinuses and frontal sinuses. Again noted is partial opacification of the left mastoid air cells Other: None IMPRESSION: No acute intracranial abnormality. Findings consistent with age related atrophy and chronic small vessel ischemia Chronic pan sinusitis Electronically Signed   By: Prudencio Pair M.D.   On: 06/15/2020 20:36   CT Cervical Spine Wo Contrast  Result Date: 06/15/2020 CLINICAL DATA:  Altered mental status EXAM: CT HEAD WITHOUT CONTRAST TECHNIQUE: Contiguous axial images were obtained from the base of the skull through the vertex without intravenous contrast. COMPARISON:  May 27, 2020 FINDINGS: Brain: No evidence of acute territorial infarction, hemorrhage, hydrocephalus,extra-axial collection or mass lesion/mass effect. There is dilatation the ventricles and sulci consistent with age-related atrophy. Low-attenuation changes in the deep white matter consistent with small vessel ischemia. Vascular: No hyperdense vessel or unexpected calcification. Skull: The skull is intact. No fracture or focal lesion identified. Sinuses/Orbits: There is complete opacification of the bilateral maxillary sinuses, ethmoid air cells, sphenoid sinuses and frontal sinuses. Again noted is partial opacification of the left mastoid air cells Other: None IMPRESSION: No acute intracranial abnormality. Findings consistent with age related atrophy and chronic small vessel ischemia Chronic pan sinusitis Electronically Signed   By: Prudencio Pair M.D.   On: 06/15/2020 20:36    ____________________________________________   PROCEDURES  Procedure(s) performed (including Critical Care):  .1-3 Lead EKG Interpretation Performed by: Lucrezia Starch, MD Authorized by: Lucrezia Starch, MD     Interpretation: normal     ECG rate assessment: normal     Rhythm: sinus rhythm     Ectopy: none     Conduction: normal        ____________________________________________   INITIAL IMPRESSION / ASSESSMENT AND PLAN / ED COURSE        Patient presents with above history exam for worsening weakness and confusion over the last day or so after recently coming home from rehab.  History is somewhat limited from the patient secondary to his confusion underlying dementia but his wife is able to provide some additional history as noted above.  On arrival he is afebrile hemodynamically stable.  Has no clear focal deficits or obvious evidence of trauma on exam.  However given history of recent fall CT head and C-spine were obtained that did not show any evidence of acute injury or other clear acute cranial process.  In addition a chest x-ray is obtained does not show any rib fractures or other clear acute intrathoracic process.  With regard to patient's weakness differential includes ACS, arrhythmia, symptomatic anemia, metabolic derangements, endocrine derangements and acute infectious process.  ECG shows no clear ischemia or arrhythmia patient denies any chest pain overall of low suspicion for ACS.  CBC shows leukocytosis with WBC count of 14 with no other significant derangements.  CMP shows no significant electrolyte or metabolic derangements.  Ammonia is nonsuggestive of hepatic encephalopathy.  Covid is negative.  UA is consistent with urinary tract infection with positive nitrites moderate LE S and 21-50 RBCs and 50 WBCs.  Urine culture sent and patient given dose of Rocephin.  Given he is not at his neurological baseline and is very weak and his wife is unable to adequately care for him at home I will plan to admit to medicine service for further evaluation management.  ____________________________________________   FINAL CLINICAL IMPRESSION(S) / ED DIAGNOSES  Final diagnoses:  Acute cystitis without hematuria  Weakness    Medications  atorvastatin (LIPITOR) tablet 10 mg (has no administration in time range)   donepezil (ARICEPT) tablet 10 mg (has no administration in time range)  FLUoxetine (PROZAC) capsule 10 mg (has no administration in time range)  doxazosin (CARDURA) tablet 2 mg (has no administration in time range)  acetaminophen (TYLENOL) tablet 650 mg (has no administration in time range)    Or  acetaminophen (TYLENOL) suppository 650 mg (has no administration in time range)  ondansetron (ZOFRAN) tablet 4 mg (has no administration in time range)    Or  ondansetron (ZOFRAN) injection 4 mg (has no administration in time range)  enoxaparin (LOVENOX) injection 40 mg (has no administration in time range)  cefTRIAXone (ROCEPHIN) 1 g in sodium chloride 0.9 % 100 mL IVPB (has no administration in time range)  aspirin EC tablet 81 mg (has no administration in time range)  lactated ringers bolus 500 mL (0 mLs Intravenous Stopped 06/15/20 2149)  acetaminophen (TYLENOL) tablet 1,000 mg (1,000 mg Oral Given 06/15/20 2103)  cefTRIAXone (ROCEPHIN) 1 g in sodium chloride 0.9 % 100 mL IVPB (1 g Intravenous New Bag/Given 06/15/20 2239)     ED Discharge Orders    None       Note:  This document was prepared using Dragon voice recognition software and may include unintentional dictation errors.   Lucrezia Starch, MD 06/15/20 [phone]

## 2020-06-15 NOTE — H&P (Signed)
History and Physical   REASON HELZER WNU:[id number] DOB: 1930-05-28 DOA: 06/15/2020  PCP: Sofie Hartigan, MD  Patient coming from: Home via EMS  I have personally briefly reviewed patient's old medical records in [place].  Chief Concern: Weakness and confusion  HPI: Randall Schneider is a 87 y.o. male with medical history significant for hypertension, dementia, hyperlipidemia, presents emergency department for chief concerns of weakness and confusion.  Per spouse at bedside patient was discharged from a rehab facility on 06/14/2020.  She visited him at the rehab facility on Monday and states that he was ambulating.  He had a Foley that was placed at the rehab facility due to urinary retention and this was removed on 06/13/2020.  Since patient has been home for 1 day, he has been very weak had difficulty walking and fell out of bed the first night he was home.  He did not know where he was.  He also complained to spouse that he had burning when he pees.  She endorses a low-grade fever with a temperature of 99.5.  She gave patient Tylenol.  EMS and 911 was called for further evaluation  At bedside patient does not appear to be in acute distress.  He was able to tell me his full name.  He states that his age is 83 when he is 49.  He states that the year is 2022.  He was not able to tell me the current president of the [place].  He knows that he is in the hospital.  And he was able to identify his father at bedside.  Social history: Patient lives at home with his spouse.  He states he is a former tobacco user and quit in 1990 and infrequently drinks alcohol.  Denies recreational drug use.  ROS: Constitutional: no weight change, no fever ENT/Mouth: no sore throat, no rhinorrhea Eyes: no eye pain, no vision changes Cardiovascular: no chest pain, no dyspnea,  no edema, no palpitations Respiratory: no cough, no sputum, no wheezing Gastrointestinal: no nausea, no vomiting, no  diarrhea, no constipation Genitourinary: no urinary incontinence, + dysuria, no hematuria Musculoskeletal: no arthralgias, no myalgias Skin: no skin lesions, no pruritus, Neuro: + weakness, no loss of consciousness, no syncope Psych: no anxiety, no depression, + decrease appetite Heme/Lymph: no bruising, no bleeding  ED Course: Gust with ED provider, patient required hospitalization due to delirium/mild acute encephalopathy secondary to urinary tract infection.  Vitals in the emergency department was remarkable for temperature of 99.5, respiration rate of 18, heart rate 65, blood pressure 168/83, patient satting SPO2 at 95% on room air.  CT of the head without contrast and CT cervical spine without contrast were read as no acute intracranial abnormality.  Findings consistent with age-related atrophy and chronic small vessel ischemia.  Chronic pansinusitis.  ED provider ordered 1 dose of ceftriaxone 1 g IV  Assessment/Plan  Principal Problem:   Weakness Active Problems:   Essential hypertension   Dementia ([facility])   Acute lower UTI   Hyperlipidemia   Weakness Mild encephalopathy secondary to UTI -UA was positive for moderate leukocytes and nitrates -Ceftriaxone IV 1 g once per ED provider -Continue ceftriaxone -Admit to MedSurg, observation, with telemetry  Weakness-PT, OT  Hyperlipidemia-atorvastatin 10 mg daily  Hypertension-resumed at 5002 mg p.o. daily at bedtime  Dementia-donepezil 10 mg p.o. daily  Depression-fluoxetine 10 mg daily resumed  Chart reviewed.   DVT prophylaxis: Enoxaparin 40 mg subcutaneous Code Status: Full code Diet: Heart healthy Family  Communication: Updated spouse at bedside Disposition Plan: Pending clinical course Consults called: None Admission status: MedSurg, with telemetry, observation  Past Medical History:  Diagnosis Date  . Arthritis   . HOH (hard of hearing)   . Hypercholesteremia   . Hypertension    Past Surgical History:   Procedure Laterality Date  . CATARACT EXTRACTION W/PHACO Right 08/06/2018   Procedure: CATARACT EXTRACTION PHACO AND INTRAOCULAR LENS PLACEMENT ([place]) RIGHT DIABETES;  Surgeon: Leandrew Koyanagi, MD;  Location: [facility];  Service: Ophthalmology;  Laterality: Right;  . CIRCUMCISION, NON-NEWBORN  05/07/2013  . HERNIA REPAIR    . TONSILLECTOMY     and adenoids  . TRANSURETHRAL RESECTION OF PROSTATE  05/07/2013   Social History:  reports that he quit smoking about 32 years ago. He has never used smokeless tobacco. He reports current alcohol use. He reports that he does not use drugs.  Allergies  Allergen Reactions  . Sulfa Antibiotics     Unknown reaction "long time ago" Other reaction(s): Unknown Other reaction(s): UNKNOWN Unknown, happened when he was a child    No family history on file. Family history: Family history reviewed and not pertinent  Prior to Admission medications   Medication Sig Start Date End Date Taking? Authorizing Provider  aspirin 81 MG tablet Take 81 mg by mouth daily.    [provider]  atorvastatin (LIPITOR) 10 MG tablet Take 10 mg by mouth daily.    [provider]  donepezil (ARICEPT) 10 MG tablet Take 10 mg by mouth daily.    [provider]  doxazosin (CARDURA) 2 MG tablet Take 2 mg by mouth at bedtime. 03/28/20   [provider]  FLUoxetine (PROZAC) 10 MG tablet Take 10 mg by mouth daily.    [provider]  Multiple Vitamins-Minerals (PRESERVISION AREDS 2 PO) Take by mouth daily.    [provider]   Physical Exam: Vitals:   06/15/20 1937 06/15/20 1938 06/15/20 2108  BP: (!) 149/75  (!) 168/83  Pulse: 85  82  Resp: (!) 21  18  Temp: 99.8 F (37.7 C)  99.5 F (37.5 C)  TempSrc: Oral  Oral  SpO2: 95%  95%  Weight:  90.7 kg   Height:  5\' 8"  (1.727 m)    Constitutional: appears age-appropriate, NAD, calm, comfortable Eyes: PERRL, lids and conjunctivae normal ENMT: Mucous  membranes are moist. Posterior pharynx clear of any exudate or lesions. Age-appropriate dentition.  Mild hearing loss Neck: normal, supple, no masses, no thyromegaly Respiratory: clear to auscultation bilaterally, no wheezing, no crackles. Normal respiratory effort. No accessory muscle use.  Cardiovascular: Regular rate and rhythm, no murmurs / rubs / gallops. No extremity edema. 2+ pedal pulses. No carotid bruits.  Abdomen: no tenderness, no masses palpated, no hepatosplenomegaly. Bowel sounds positive.  Musculoskeletal: no clubbing / cyanosis. No joint deformity upper and lower extremities. Good ROM, no contractures, no atrophy. Normal muscle tone.  Skin: no rashes, lesions, ulcers. No induration Neurologic: Sensation intact. Strength 5/5 in all 4.  Psychiatric: Normal judgment and insight. Alert and oriented x self, and location of hospital, and year. Normal mood.   EKG: independently reviewed, showing sinus rhythm with rate of 87, QTc 442  Chest x-ray on Admission: I personally reviewed and I agree with radiologist reading as below.  DG Chest 2 View  Result Date: 06/15/2020 CLINICAL DATA:  Fall EXAM: CHEST - 2 VIEW COMPARISON:  None. FINDINGS: The heart size and mediastinal contours are within normal limits. Both lungs  are clear. The visualized skeletal structures are unremarkable. IMPRESSION: No active cardiopulmonary disease. Electronically Signed   By: Prudencio Pair M.D.   On: 06/15/2020 20:25   CT Head Wo Contrast  Result Date: 06/15/2020 CLINICAL DATA:  Altered mental status EXAM: CT HEAD WITHOUT CONTRAST TECHNIQUE: Contiguous axial images were obtained from the base of the skull through the vertex without intravenous contrast. COMPARISON:  May 27, 2020 FINDINGS: Brain: No evidence of acute territorial infarction, hemorrhage, hydrocephalus,extra-axial collection or mass lesion/mass effect. There is dilatation the ventricles and sulci consistent with age-related atrophy. Low-attenuation  changes in the deep white matter consistent with small vessel ischemia. Vascular: No hyperdense vessel or unexpected calcification. Skull: The skull is intact. No fracture or focal lesion identified. Sinuses/Orbits: There is complete opacification of the bilateral maxillary sinuses, ethmoid air cells, sphenoid sinuses and frontal sinuses. Again noted is partial opacification of the left mastoid air cells Other: None IMPRESSION: No acute intracranial abnormality. Findings consistent with age related atrophy and chronic small vessel ischemia Chronic pan sinusitis Electronically Signed   By: Prudencio Pair M.D.   On: 06/15/2020 20:36   CT Cervical Spine Wo Contrast  Result Date: 06/15/2020 CLINICAL DATA:  Altered mental status EXAM: CT HEAD WITHOUT CONTRAST TECHNIQUE: Contiguous axial images were obtained from the base of the skull through the vertex without intravenous contrast. COMPARISON:  May 27, 2020 FINDINGS: Brain: No evidence of acute territorial infarction, hemorrhage, hydrocephalus,extra-axial collection or mass lesion/mass effect. There is dilatation the ventricles and sulci consistent with age-related atrophy. Low-attenuation changes in the deep white matter consistent with small vessel ischemia. Vascular: No hyperdense vessel or unexpected calcification. Skull: The skull is intact. No fracture or focal lesion identified. Sinuses/Orbits: There is complete opacification of the bilateral maxillary sinuses, ethmoid air cells, sphenoid sinuses and frontal sinuses. Again noted is partial opacification of the left mastoid air cells Other: None IMPRESSION: No acute intracranial abnormality. Findings consistent with age related atrophy and chronic small vessel ischemia Chronic pan sinusitis Electronically Signed   By: Prudencio Pair M.D.   On: 06/15/2020 20:36   Labs on Admission: I have personally reviewed following labs  CBC: Recent Labs  Lab 06/15/20 1948  WBC 14.1*  NEUTROABS 11.7*  HGB 12.9*  HCT  38.1*  MCV 90.3  PLT 166   Basic Metabolic Panel: Recent Labs  Lab 06/15/20 1948  NA 138  K 4.0  CL 106  CO2 23  GLUCOSE 119*  BUN 20  CREATININE 1.01  CALCIUM 8.9   GFR: Estimated Creatinine Clearance: 56.3 mL/min (by C-G formula based on SCr of 1.01 mg/dL).  Liver Function Tests: Recent Labs  Lab 06/15/20 1948  AST 20  ALT 17  ALKPHOS 105  BILITOT 1.1  PROT 7.3  ALBUMIN 3.3*   Recent Labs  Lab 06/15/20 2151  AMMONIA 12   Thyroid Function Tests: Recent Labs    06/15/20 1948  TSH 4.335   Urine analysis:    Component Value Date/Time   COLORURINE YELLOW (A) 06/15/2020 2139   APPEARANCEUR TURBID (A) 06/15/2020 2139   LABSPEC 1.017 06/15/2020 2139   PHURINE 6.0 06/15/2020 2139   GLUCOSEU NEGATIVE 06/15/2020 2139   HGBUR MODERATE (A) 06/15/2020 2139   BILIRUBINUR NEGATIVE 06/15/2020 2139   KETONESUR 5 (A) 06/15/2020 2139   PROTEINUR 100 (A) 06/15/2020 2139   NITRITE POSITIVE (A) 06/15/2020 2139   LEUKOCYTESUR MODERATE (A) 06/15/2020 2139   Cam Harnden N Giorgia Wahler D.O. [facility]  If 7PM-7AM, please contact overnight-coverage provider If  7AM-7PM, please contact day coverage provider [email or web address]  06/15/2020, 10:45 PM

## 2020-06-16 DIAGNOSIS — R531 Weakness: Secondary | ICD-10-CM | POA: Diagnosis not present

## 2020-06-16 LAB — CBC
HCT: 35.4 % — ABNORMAL LOW (ref 39.0–52.0)
Hemoglobin: 11.8 g/dL — ABNORMAL LOW (ref 13.0–17.0)
MCH: 30.3 pg (ref 26.0–34.0)
MCHC: 33.3 g/dL (ref 30.0–36.0)
MCV: 90.8 fL (ref 80.0–100.0)
Platelets: 198 10*3/uL (ref 150–400)
RBC: 3.9 MIL/uL — ABNORMAL LOW (ref 4.22–5.81)
RDW: 13.1 % (ref 11.5–15.5)
WBC: 11.7 10*3/uL — ABNORMAL HIGH (ref 4.0–10.5)
nRBC: 0 % (ref 0.0–0.2)

## 2020-06-16 LAB — BASIC METABOLIC PANEL
Anion gap: 8 (ref 5–15)
BUN: 20 mg/dL (ref 8–23)
CO2: 25 mmol/L (ref 22–32)
Calcium: 8.8 mg/dL — ABNORMAL LOW (ref 8.9–10.3)
Chloride: 106 mmol/L (ref 98–111)
Creatinine, Ser: 1.01 mg/dL (ref 0.61–1.24)
GFR, Estimated: >60 mL/min (ref >60)
Glucose, Bld: 97 mg/dL (ref 70–99)
Potassium: 3.7 mmol/L (ref 3.5–5.1)
Sodium: 139 mmol/L (ref 135–145)

## 2020-06-16 MED ORDER — CHLORHEXIDINE GLUCONATE CLOTH 2 % EX PADS
6.0000 | MEDICATED_PAD | Freq: Every day | CUTANEOUS | Status: DC
  Administered 2020-06-17: 6 via TOPICAL

## 2020-06-16 MED ORDER — SODIUM CHLORIDE 0.9 % IV SOLN: INTRAVENOUS | Status: DC | PRN

## 2020-06-16 NOTE — Progress Notes (Signed)
Patient tolerated foley insertion well. Immediate urine return. No resistance noted. Patient denies any pain.   Fuller Mandril, RN

## 2020-06-16 NOTE — Evaluation (Signed)
Physical Therapy Evaluation Patient Details Name: Randall Schneider MRN: [id number] DOB: 07-Jun-1930 Today's Date: 06/16/2020   History of Present Illness  Randall Schneider is a 87 y.o. male with medical history significant for hypertension, dementia, hyperlipidemia, presents emergency department for chief concerns of weakness and confusion.     Per spouse at bedside patient was discharged from a rehab facility on 06/14/2020.  She visited him at the rehab facility on Monday and states that he was ambulating.  He had a Foley that was placed at the rehab facility due to urinary retention and this was removed on 06/13/2020.  Clinical Impression  Patient received in bed, he is very pleasant, confused, requires repeated cues for mobility. Patient's wife present for evaluation. She reports he was unable to do anything when he got home. Patient requires mod assist for supine to sit, sit to stand from elevated surface with min assist and cues to lean forward. Mod assist for ambulation of 10 feet with RW and multimodal cues for direction, use of RW.  Per wife, patient is better than he had been last 2 days. She plans to take him back home with HHPT. Patient will continue to benefit from skilled PT while here to improve functional mobility to reduce caregiver burden.        Follow Up Recommendations Home health PT    Equipment Recommendations  None recommended by PT    Recommendations for Other Services       Precautions / Restrictions Precautions Precautions: Fall Restrictions Weight Bearing Restrictions: No      Mobility  Bed Mobility Overal bed mobility: Needs Assistance Bed Mobility: Supine to Sit;Sit to Supine     Supine to sit: Mod assist Sit to supine: Min assist   General bed mobility comments: Mod assist to raise trunk to seated position. Assist to bring legs around to floor. Min assist returning to bed to assist legs and assist with positioning.    Transfers Overall transfer level: Needs  assistance Equipment used: Rolling walker (2 wheeled) Transfers: Sit to/from Stand Sit to Stand: Min assist;From elevated surface         General transfer comment: Cues for "nose over toes" to assist with standing from elevated surface.  Ambulation/Gait Ambulation/Gait assistance: Mod assist Gait Distance (Feet): 10 Feet Assistive device: Rolling walker (2 wheeled) Gait Pattern/deviations: Decreased step length - right;Decreased step length - left;Step-to pattern;Shuffle;Trunk flexed Gait velocity: decreased   General Gait Details: forward flexed posture with downward gaze, very short shuffling steps; maintains RW arms length anterior to patient, mod cuing/assist for walker position and management.  Stairs            Wheelchair Mobility    Modified Rankin (Stroke Patients Only)       Balance Overall balance assessment: Needs assistance Sitting-balance support: Feet supported Sitting balance-Leahy Scale: Good     Standing balance support: Bilateral upper extremity supported;During functional activity Standing balance-Leahy Scale: Poor Standing balance comment: mod assist, poor use of AD                             Pertinent Vitals/Pain Pain Assessment: No/denies pain    Home Living Family/patient expects to be discharged to:: Private residence Living Arrangements: Spouse/significant other Available Help at Discharge: Family;Available 24 hours/day Type of Home: House Home Access: Ramped entrance     Home Layout: One level Home Equipment: Walker - 2 wheels      Prior  Function Level of Independence: Independent with assistive device(s)         Comments: Patient just returned home from rehab. Was walking with walker there, got home and was very weak, unable to get up.     Hand Dominance        Extremity/Trunk Assessment   Upper Extremity Assessment Upper Extremity Assessment: Overall WFL for tasks assessed    Lower Extremity  Assessment Lower Extremity Assessment: Generalized weakness    Cervical / Trunk Assessment Cervical / Trunk Assessment: Normal  Communication   Communication: HOH  Cognition Arousal/Alertness: Awake/alert Behavior During Therapy: WFL for tasks assessed/performed Overall Cognitive Status: Within Functional Limits for tasks assessed                                 General Comments: Oriented to self, follows commands, repeats questions. Cues needed for problem solving.      General Comments      Exercises     Assessment/Plan    PT Assessment Patient needs continued PT services  PT Problem List Decreased strength;Decreased activity tolerance;Decreased balance;Decreased mobility;Decreased coordination;Decreased cognition;Decreased knowledge of use of DME;Decreased safety awareness;Decreased knowledge of precautions;Cardiopulmonary status limiting activity       PT Treatment Interventions DME instruction;Gait training;Functional mobility training;Stair training;Therapeutic activities;Therapeutic exercise;Balance training;Patient/family education    PT Goals (Current goals can be found in the Care Plan section)  Acute Rehab PT Goals Patient Stated Goal: per wife, to return home and start home therapy PT Goal Formulation: With family Time For Goal Achievement: 06/30/20 Potential to Achieve Goals: Good    Frequency Min 2X/week   Barriers to discharge        Co-evaluation               AM-PAC PT "6 Clicks" Mobility  Outcome Measure Help needed turning from your back to your side while in a flat bed without using bedrails?: A Little Help needed moving from lying on your back to sitting on the side of a flat bed without using bedrails?: A Lot Help needed moving to and from a bed to a chair (including a wheelchair)?: A Lot Help needed standing up from a chair using your arms (e.g., wheelchair or bedside chair)?: A Lot Help needed to walk in hospital room?: A  Lot Help needed climbing 3-5 steps with a railing? : Total 6 Click Score: 12    End of Session Equipment Utilized During Treatment: Gait belt Activity Tolerance: Patient tolerated treatment well Patient left: in bed;with call bell/phone within reach;with bed alarm set;with family/visitor present Nurse Communication: Mobility status PT Visit Diagnosis: Repeated falls (R29.6);Muscle weakness (generalized) (M62.81);Difficulty in walking, not elsewhere classified (R26.2);Other abnormalities of gait and mobility (R26.89);Unsteadiness on feet (R26.81)    Time: 8366-2947 PT Time Calculation (min) (ACUTE ONLY): 22 min   Charges:   PT Evaluation $PT Eval Moderate Complexity: 1 Mod PT Treatments $Gait Training: 8-22 mins        Katia Hannen, PT, GCS 06/16/20,11:47 AM

## 2020-06-16 NOTE — Evaluation (Signed)
Occupational Therapy Evaluation Patient Details Name: Randall Schneider MRN: [id number] DOB: [birth year] Today's Date: 06/16/2020    History of Present Illness Randall Schneider is a 87 y.o. male with medical history significant for hypertension, dementia, hyperlipidemia, presents emergency department for chief concerns of weakness and confusion.     Per spouse at bedside patient was discharged from a rehab facility on 06/14/2020.  She visited him at the rehab facility on Monday and states that he was ambulating.  He had a Foley that was placed at the rehab facility due to urinary retention and this was removed on 06/13/2020.   Clinical Impression   Pt was seen for OT evaluation this date. Prior to hospital admission, pt was home only for a few days after short term rehab stay. Pt lives with his spouse who per report plans to take pt home with [facility] services at discharge and has already rented a hospital bed in anticipation. Pt pleasant, and agreeable to session. Follows commands well with occasional cues. Currently pt demonstrates impairments as described below (See OT problem list) which functionally limit his ability to perform ADL/self-care tasks. Pt currently requires MIN-MOD A for bed mobility with cues for hand placement to support success, CGA-MIN A for ADL transfers with cues for hand placement, and CGA for side steps with RW, and MIN-MOD A for LB ADL tasks. Pt would benefit from skilled OT services to address noted impairments and functional limitations (see below for any additional details) in order to maximize safety and independence while minimizing falls risk and caregiver burden. Upon hospital discharge, recommend [place] and 24/7 supervision/assist to maximize pt safety and return to functional independence during meaningful occupations of daily life.     Follow Up Recommendations  Home health OT;Supervision/Assistance - 24 hour    Equipment Recommendations  None recommended by OT    Recommendations  for Other Services       Precautions / Restrictions Precautions Precautions: Fall Restrictions Weight Bearing Restrictions: No      Mobility Bed Mobility Overal bed mobility: Needs Assistance Bed Mobility: Supine to Sit;Sit to Supine     Supine to sit: Mod assist Sit to supine: Min assist   General bed mobility comments: tendency to maintain back extension, improves with cues for hand placement to assist himself    Transfers Overall transfer level: Needs assistance Equipment used: Rolling walker (2 wheeled) Transfers: Sit to/from Stand Sit to Stand: Min assist;From elevated surface         General transfer comment: cues for anterior weight shift    Balance Overall balance assessment: Needs assistance Sitting-balance support: Feet supported Sitting balance-Leahy Scale: Good     Standing balance support: Bilateral upper extremity supported;During functional activity Standing balance-Leahy Scale: Poor Standing balance comment: requires BUE support on RW, CGA-Min A for side stepping                           ADL either performed or assessed with clinical judgement   ADL Overall ADL's : Needs assistance/impaired                                       General ADL Comments: MIN A for LB ADL, CGA-MIN A for ADL transfers, MOD A toileting hygiene     Vision Patient Visual Report: No change from baseline       Perception  Praxis      Pertinent Vitals/Pain Pain Assessment: No/denies pain     Hand Dominance     Extremity/Trunk Assessment Upper Extremity Assessment Upper Extremity Assessment: Overall WFL for tasks assessed   Lower Extremity Assessment Lower Extremity Assessment: Generalized weakness   Cervical / Trunk Assessment Cervical / Trunk Assessment: Normal   Communication Communication Communication: HOH   Cognition Arousal/Alertness: Awake/alert Behavior During Therapy: WFL for tasks assessed/performed Overall  Cognitive Status: History of cognitive impairments - at baseline                                 General Comments: Alert and oriented to self and generally to place, follows commands with cues, very receptive   General Comments       Exercises Other Exercises Other Exercises: Pt instructed in bed mobility, ADL transfers, falls prevention, and RW mgt   Shoulder Instructions      Home Living Family/patient expects to be discharged to:: Private residence Living Arrangements: Spouse/significant other Available Help at Discharge: Family;Available 24 hours/day Type of Home: House Home Access: Ramped entrance     Home Layout: One level               Home Equipment: Walker - 2 wheels          Prior Functioning/Environment Level of Independence: Independent with assistive device(s)        Comments: Patient just returned home from rehab. Was walking with walker there, got home and was very weak, unable to get up.        OT Problem List: Decreased strength;Decreased cognition;Decreased safety awareness;Decreased knowledge of use of DME or AE;Impaired balance (sitting and/or standing)      OT Treatment/Interventions: Self-care/ADL training;Therapeutic exercise;Therapeutic activities;Cognitive remediation/compensation;DME and/or AE instruction;Patient/family education;Balance training    OT Goals(Current goals can be found in the care plan section) Acute Rehab OT Goals Patient Stated Goal: per wife, to return home and start home therapy OT Goal Formulation: With patient Time For Goal Achievement: 06/30/20 Potential to Achieve Goals: Good ADL Goals Pt Will Perform Lower Body Dressing: with caregiver independent in assisting;sit to/from stand Pt Will Transfer to Toilet: with supervision;ambulating (LRAD for amb, elevated commode, caregiver assisting) Pt Will Perform Toileting - Clothing Manipulation and hygiene: sit to/from stand;with supervision (PRN assist  from caregiver)  OT Frequency: Min 2X/week   Barriers to D/C:            Co-evaluation              AM-PAC OT "6 Clicks" Daily Activity     Outcome Measure Help from another person eating meals?: None Help from another person taking care of personal grooming?: A Little Help from another person toileting, which includes using toliet, bedpan, or urinal?: A Little Help from another person bathing (including washing, rinsing, drying)?: A Lot Help from another person to put on and taking off regular upper body clothing?: A Little Help from another person to put on and taking off regular lower body clothing?: A Lot 6 Click Score: 17   End of Session Equipment Utilized During Treatment: Rolling walker;Gait belt Nurse Communication: Mobility status  Activity Tolerance: Patient tolerated treatment well Patient left: in bed;with call bell/phone within reach;with bed alarm set  OT Visit Diagnosis: Other abnormalities of gait and mobility (R26.89);Repeated falls (R29.6);Muscle weakness (generalized) (M62.81)  Time: 1350-1408 OT Time Calculation (min): 18 min Charges:  OT General Charges $OT Visit: 1 Visit OT Evaluation $OT Eval Moderate Complexity: 1 Mod OT Treatments $Self Care/Home Management : 8-22 mins  Hanley Hays, MPH, MS, OTR/L ascom [phone] 06/16/20, 2:44 PM

## 2020-06-16 NOTE — Plan of Care (Signed)
  Problem: Nutrition: Goal: Adequate nutrition will be maintained Outcome: Progressing   Problem: Elimination: Goal: Will not experience complications related to urinary retention Outcome: Progressing   Problem: Pain Managment: Goal: General experience of comfort will improve Outcome: Progressing   Problem: Safety: Goal: Ability to remain free from injury will improve Outcome: Progressing   

## 2020-06-16 NOTE — TOC Initial Note (Signed)
Transition of Care [facility]) - Initial/Assessment Note    Patient Details  Name: Randall Schneider MRN: [id number] Date of Birth: 11-17-1930  Transition of Care [facility]) CM/SW Contact:    Candie Chroman, LCSW Phone Number: 06/16/2020, 12:35 PM  Clinical Narrative: Patient was at [facility] from 3/21-4/5. Per admissions coordinator they set him up with [employer] and ordered a wheelchair. CSW called wife who stated wheelchair had already been delivered and that she had rented a hospital bed which will be delivered today. Wife hoping to take patient home by car when discharged. No further concerns. CSW encouraged patient's wife to contact CSW as needed. CSW will continue to follow patient for support and facilitate return home when stable.                 Expected Discharge Plan: [place] Barriers to Discharge: Continued Medical Work up   Patient Goals and CMS Choice        Expected Discharge Plan and Services Expected Discharge Plan: [place] Choice: Resumption of Svcs/PTA Provider Living arrangements for the past 2 months: [place]: RN,PT [place] Agency: [place] ([place]) Date [place]: 06/16/20   Representative spoke with at [place]: Floydene Flock  Prior Living Arrangements/Services Living arrangements for the past 2 months: [place] Lives with:: Spouse Patient language and need for interpreter reviewed:: Yes Do you feel safe going back to the place where you live?: Yes      Need for Family Participation in Patient Care: Yes (Comment) Care giver support system in place?: Yes (comment) Current home services: DME,Home PT,Home RN Criminal Activity/Legal Involvement Pertinent to Current Situation/Hospitalization: No - Comment as needed  Activities of Daily Living      Permission Sought/Granted Permission sought  to share information with : Family Supports    Share Information with NAME: Jahmar Mckelvy     Permission granted to share info w Relationship: Wife  Permission granted to share info w Contact Information: [phone]  Emotional Assessment Appearance:: Appears stated age Attitude/Demeanor/Rapport: Unable to Assess Affect (typically observed): Unable to Assess Orientation: : Oriented to Self,Oriented to Place Alcohol / Substance Use: Not Applicable Psych Involvement: No (comment)  Admission diagnosis:  Weakness [R53.1] Acute cystitis without hematuria [N30.00] Patient Active Problem List   Diagnosis Date Noted  . Weakness 06/15/2020  . Essential hypertension 06/15/2020  . Dementia ([place]) 06/15/2020  . Acute lower UTI 06/15/2020  . Hyperlipidemia 06/15/2020   PCP:  Sofie Hartigan, MD Pharmacy:   [facility] [phone] [address], [address] [place] [address] Phone: [phone] Fax: [phone]     Social Determinants of Health (SDOH) Interventions    Readmission Risk Interventions No flowsheet data found.

## 2020-06-16 NOTE — Progress Notes (Signed)
PROGRESS NOTE    [name]  GUR:[id number] DOB: [birth year] DOA: 06/15/2020 PCP: Sofie Hartigan, MD   Brief Narrative: Taken from H&P. ERBIE Schneider is a 87 y.o. male with medical history significant for hypertension, dementia, hyperlipidemia, presents emergency department for chief concerns of weakness and confusion. Patient had a recent admission with urinary retention and was discharged to a rehab facility, came back home from rehab on 06/14/2020.  Foley was removed on 06/13/2020 at rehab facility. Came to ED with confusion and generalized weakness.  He was also complaining of dysuria and low-grade fever for which his wife was giving him Tylenol. CT head and cervical spine was without any acute abnormality.  Findings consistent with age-related atrophy, chronic small vessel ischemia and chronic pansinusitis. UA concerning for UTI, urine cultures pending, CBC with leukocytosis, he was started on ceftriaxone. Developed another urinary retention today, bladder scan with more than 600 cc, coude catheter was placed after consulting urology. Can be discharged tomorrow morning on p.o. antibiotics once culture results are available.  Subjective: Per patient he thinks that he is little improved.  Continues to have dysuria.  Did not voided since last night.  Wife at bedside and was concerned that he required multiple attempts to place Foley catheter during previous hospitalization.  Assessment & Plan:   Principal Problem:   Weakness Active Problems:   Essential hypertension   Dementia (HCC)   Acute lower UTI   Hyperlipidemia  UTI.  UA positive for moderate leukocytes and nitrates.  Recently removed Foley catheter which was inserted during prior hospitalization in March 2020 due to urinary retention. -Continue with ceftriaxone-we will de-escalate once culture results are available -Follow-up culture results.  Recurrent urinary retention.  Due to his history of prior difficult  catheterization, we talked with urology and a successful coude catheter was placed. -Patient will follow up with urology as an outpatient  Generalized weakness.  Patient just discharged from rehab.  PT evaluated him and are recommending home health services.  Dyslipidemia. -Continue home dose of atorvastatin.  Hypertension.  Blood pressure within goal. -Continue home dose of Cardura  Dementia.  High risk for sundowning effect. -Continue home dose of donepezil  Depression.  No acute concern -Continue home dose of fluoxetine  Objective: Vitals:   06/16/20 0811 06/16/20 1211 06/16/20 1503 06/16/20 1932  BP: 133/61 (!) 115/58 126/66 127/63  Pulse: 65 71 63 79  Resp: 15 18 17 20   Temp: 98 F (36.7 C) 98.1 F (36.7 C) 98.1 F (36.7 C) 99.4 F (37.4 C)  TempSrc: Oral   Oral  SpO2: 94% 94% 98% 94%  Weight:      Height:        Intake/Output Summary (Last 24 hours) at 06/16/2020 2024 Last data filed at 06/16/2020 1814 Gross per 24 hour  Intake 1420 ml  Output 650 ml  Net 770 ml   Filed Weights   06/15/20 1938  Weight: 90.7 kg    Examination:  General exam: Appears calm and comfortable  Respiratory system: Clear to auscultation. Respiratory effort normal. Cardiovascular system: S1 & S2 heard, RRR. No JVD, murmurs, rubs, gallops or clicks. Gastrointestinal system: Soft, nontender, nondistended, bowel sounds positive. Central nervous system: Alert and oriented. No focal neurological deficits.Symmetric 5 x 5 power. Extremities: No edema, no cyanosis, pulses intact and symmetrical. Psychiatry: Judgement and insight appear normal. Mood & affect appropriate.    DVT prophylaxis: Lovenox Code Status: Full Family Communication: Wife was updated at bedside Disposition Plan:  Status is: Observation  The patient remains OBS appropriate and will d/c before 2 midnights.  Dispo: The patient is from: Home              Anticipated d/c is to: Home              Patient currently is  not medically stable to d/c.   Difficult to place patient No              Level of care: Med-Surg  All the records are reviewed and case discussed with Care Management/Social Worker. Management plans discussed with the patient, nursing and they are in agreement.  Consultants:   Curbside urology  Procedures:  Antimicrobials:  Ceftriaxone  Data Reviewed: I have personally reviewed following labs and imaging studies  CBC: Recent Labs  Lab 06/15/20 1948 06/16/20 0613  WBC 14.1* 11.7*  NEUTROABS 11.7*  --   HGB 12.9* 11.8*  HCT 38.1* 35.4*  MCV 90.3 90.8  PLT 210 811   Basic Metabolic Panel: Recent Labs  Lab 06/15/20 1948 06/16/20 0613  NA 138 139  K 4.0 3.7  CL 106 106  CO2 23 25  GLUCOSE 119* 97  BUN 20 20  CREATININE 1.01 1.01  CALCIUM 8.9 8.8*   GFR: Estimated Creatinine Clearance: 56.3 mL/min (by C-G formula based on SCr of 1.01 mg/dL). Liver Function Tests: Recent Labs  Lab 06/15/20 1948  AST 20  ALT 17  ALKPHOS 105  BILITOT 1.1  PROT 7.3  ALBUMIN 3.3*   No results for input(s): LIPASE, AMYLASE in the last 168 hours. Recent Labs  Lab 06/15/20 2151  AMMONIA 12   Coagulation Profile: No results for input(s): INR, PROTIME in the last 168 hours. Cardiac Enzymes: No results for input(s): CKTOTAL, CKMB, CKMBINDEX, TROPONINI in the last 168 hours. BNP (last 3 results) No results for input(s): PROBNP in the last 8760 hours. HbA1C: No results for input(s): HGBA1C in the last 72 hours. CBG: No results for input(s): GLUCAP in the last 168 hours. Lipid Profile: No results for input(s): CHOL, HDL, LDLCALC, TRIG, CHOLHDL, LDLDIRECT in the last 72 hours. Thyroid Function Tests: Recent Labs    06/15/20 1948  TSH 4.335   Anemia Panel: No results for input(s): VITAMINB12, FOLATE, FERRITIN, TIBC, IRON, RETICCTPCT in the last 72 hours. Sepsis Labs: No results for input(s): PROCALCITON, LATICACIDVEN in the last 168 hours.  Recent Results (from the past  240 hour(s))  Resp Panel by RT-PCR (Flu A&B, Covid) Nasopharyngeal Swab     Status: None   Collection Time: 06/15/20  9:51 PM   Specimen: Nasopharyngeal Swab; Nasopharyngeal(NP) swabs in vial transport medium  Result Value Ref Range Status   SARS Coronavirus 2 by RT PCR NEGATIVE NEGATIVE Final    Comment: (NOTE) SARS-CoV-2 target nucleic acids are NOT DETECTED.  The SARS-CoV-2 RNA is generally detectable in upper respiratory specimens during the acute phase of infection. The lowest concentration of SARS-CoV-2 viral copies this assay can detect is 138 copies/mL. A negative result does not preclude SARS-Cov-2 infection and should not be used as the sole basis for treatment or other patient management decisions. A negative result may occur with  improper specimen collection/handling, submission of specimen other than nasopharyngeal swab, presence of viral mutation(s) within the areas targeted by this assay, and inadequate number of viral copies(<138 copies/mL). A negative result must be combined with clinical observations, patient history, and epidemiological information. The expected result is Negative.  Fact Sheet for Patients:  EntrepreneurPulse.com.au  Fact Sheet for Healthcare Providers:  IncredibleEmployment.be  This test is no t yet approved or cleared by the [place] FDA and  has been authorized for detection and/or diagnosis of SARS-CoV-2 by FDA under an Emergency Use Authorization (EUA). This EUA will remain  in effect (meaning this test can be used) for the duration of the COVID-19 declaration under Section 564(b)(1) of the Act, 21 U.S.C.section 360bbb-3(b)(1), unless the authorization is terminated  or revoked sooner.       Influenza A by PCR NEGATIVE NEGATIVE Final   Influenza B by PCR NEGATIVE NEGATIVE Final    Comment: (NOTE) The Xpert Xpress SARS-CoV-2/FLU/RSV plus assay is intended as an aid in the diagnosis of influenza  from Nasopharyngeal swab specimens and should not be used as a sole basis for treatment. Nasal washings and aspirates are unacceptable for Xpert Xpress SARS-CoV-2/FLU/RSV testing.  Fact Sheet for Patients: EntrepreneurPulse.com.au  Fact Sheet for Healthcare Providers: IncredibleEmployment.be  This test is not yet approved or cleared by the [place] FDA and has been authorized for detection and/or diagnosis of SARS-CoV-2 by FDA under an Emergency Use Authorization (EUA). This EUA will remain in effect (meaning this test can be used) for the duration of the COVID-19 declaration under Section 564(b)(1) of the Act, 21 U.S.C. section 360bbb-3(b)(1), unless the authorization is terminated or revoked.  Performed at [facility], [address], [place], [place] [address]      Radiology Studies: DG Chest 2 View  Result Date: 06/15/2020 CLINICAL DATA:  Fall EXAM: CHEST - 2 VIEW COMPARISON:  None. FINDINGS: The heart size and mediastinal contours are within normal limits. Both lungs are clear. The visualized skeletal structures are unremarkable. IMPRESSION: No active cardiopulmonary disease. Electronically Signed   By: Prudencio Pair M.D.   On: 06/15/2020 20:25   CT Head Wo Contrast  Result Date: 06/15/2020 CLINICAL DATA:  Altered mental status EXAM: CT HEAD WITHOUT CONTRAST TECHNIQUE: Contiguous axial images were obtained from the base of the skull through the vertex without intravenous contrast. COMPARISON:  May 27, 2020 FINDINGS: Brain: No evidence of acute territorial infarction, hemorrhage, hydrocephalus,extra-axial collection or mass lesion/mass effect. There is dilatation the ventricles and sulci consistent with age-related atrophy. Low-attenuation changes in the deep white matter consistent with small vessel ischemia. Vascular: No hyperdense vessel or unexpected calcification. Skull: The skull is intact. No fracture or focal lesion  identified. Sinuses/Orbits: There is complete opacification of the bilateral maxillary sinuses, ethmoid air cells, sphenoid sinuses and frontal sinuses. Again noted is partial opacification of the left mastoid air cells Other: None IMPRESSION: No acute intracranial abnormality. Findings consistent with age related atrophy and chronic small vessel ischemia Chronic pan sinusitis Electronically Signed   By: Prudencio Pair M.D.   On: 06/15/2020 20:36   CT Cervical Spine Wo Contrast  Result Date: 06/15/2020 CLINICAL DATA:  Altered mental status EXAM: CT HEAD WITHOUT CONTRAST TECHNIQUE: Contiguous axial images were obtained from the base of the skull through the vertex without intravenous contrast. COMPARISON:  May 27, 2020 FINDINGS: Brain: No evidence of acute territorial infarction, hemorrhage, hydrocephalus,extra-axial collection or mass lesion/mass effect. There is dilatation the ventricles and sulci consistent with age-related atrophy. Low-attenuation changes in the deep white matter consistent with small vessel ischemia. Vascular: No hyperdense vessel or unexpected calcification. Skull: The skull is intact. No fracture or focal lesion identified. Sinuses/Orbits: There is complete opacification of the bilateral maxillary sinuses, ethmoid air cells, sphenoid sinuses and frontal sinuses. Again noted is partial opacification of  the left mastoid air cells Other: None IMPRESSION: No acute intracranial abnormality. Findings consistent with age related atrophy and chronic small vessel ischemia Chronic pan sinusitis Electronically Signed   By: Prudencio Pair M.D.   On: 06/15/2020 20:36    Scheduled Meds: . aspirin EC  81 mg Oral Daily  . atorvastatin  10 mg Oral Daily  . Chlorhexidine Gluconate Cloth  6 each Topical Daily  . donepezil  10 mg Oral Daily  . doxazosin  2 mg Oral QHS  . enoxaparin (LOVENOX) injection  40 mg Subcutaneous Q2200  . FLUoxetine  10 mg Oral Daily   Continuous Infusions: . sodium chloride     . cefTRIAXone (ROCEPHIN)  IV Stopped (06/16/20 1015)     LOS: 0 days   Time spent: 35 minutes. More than 50% of the time was spent in counseling/coordination of care  Lorella Nimrod, MD [facility]  If 7PM-7AM, please contact night-coverage [email or web address]  06/16/2020, 8:24 PM   This record has been created using Systems analyst. Errors have been sought and corrected,but may not always be located. Such creation errors do not reflect on the standard of care.

## 2020-06-17 DIAGNOSIS — R531 Weakness: Secondary | ICD-10-CM | POA: Diagnosis not present

## 2020-06-17 MED ORDER — CEFDINIR 300 MG PO CAPS: 300.0000 mg | ORAL_CAPSULE | Freq: Two times a day (BID) | ORAL | 0 refills | Status: AC

## 2020-06-17 NOTE — Plan of Care (Signed)

## 2020-06-17 NOTE — Progress Notes (Signed)
reviewed

## 2020-06-17 NOTE — Discharge Summary (Signed)
Physician Discharge Summary  TRASK VOSLER EZM:[id number] DOB: 10-18-1930 DOA: 06/15/2020  PCP: Sofie Hartigan, MD  Admit date: 06/15/2020 Discharge date: 06/17/2020  Admitted From: Home Disposition: Home  Recommendations for Outpatient Follow-up:  1. Follow up with PCP in 1-2 weeks 2. Follow-up with urology in 1-2 weeks for further assessment/evaluation of continued bladder outlet obstruction requiring Foley catheter 3. Continue cefdinir 300 mg p.o. twice daily to complete 10-day course for UTI 4. Please obtain BMP/CBC in one week 5. Please follow up on the following pending results: Urine culture which is pending at time of discharge  Home Health: PT/OT/RN/social work Equipment/Devices: None  Discharge Condition: Stable CODE STATUS: Full code Diet recommendation: Heart healthy diet  History of present illness:  Randall Schneider a 87 y.o.malewith medical history significant forhypertension, dementia, hyperlipidemia, presents emergency department for chief concerns of weakness and confusion. Patient had a recent admission with urinary retention and was discharged to a rehab facility, came back home from rehab on 06/14/2020.  Foley was removed on 06/13/2020 at rehab facility. Came to ED with confusion and generalized weakness.  He was also complaining of dysuria and low-grade fever for which his wife was giving him Tylenol. CT head and cervical spine was without any acute abnormality.  Findings consistent with age-related atrophy, chronic small vessel ischemia and chronic pansinusitis. UA concerning for UTI, urine cultures pending, CBC with leukocytosis, he was started on ceftriaxone.  Hospital course:  Urinary tract infection UA positive for moderate leukocytes and nitrates.  Recently removed Foley catheter which was inserted during prior hospitalization in March due to urinary retention, and removed by urology on 06/13/2020.  Ceftriaxone and will discharge on cefdinir 300 mg p.o. twice  daily to complete 10-day course. Continue Foley catheter and needs close follow-up with his urologist on discharge.  Follow-up urine culture which was pending at time of discharge. -Continue with ceftriaxone-we will de-escalate once culture results are available -Follow-up culture results.  Recurrent urinary retention.   Due to his history of prior difficult catheterization, we talked with urology and a successful coude catheter was placed. Patient will follow up with urology as an outpatient  Generalized weakness.   Patient just discharged from rehab.  PT/OT evaluated him and are recommending home health services.  Dyslipidemia. Continue atorvastatin.  Hypertension.   Continue Cardura  Dementia. Continue donepezil  Depression.  No acute concern -Continue home dose of fluoxetine  Discharge Diagnoses:  Principal Problem:   Weakness Active Problems:   Essential hypertension   Dementia ([place])   Acute lower UTI   Hyperlipidemia    Discharge Instructions  Discharge Instructions    Call MD for:  difficulty breathing, headache or visual disturbances   Complete by: As directed    Call MD for:  extreme fatigue   Complete by: As directed    Call MD for:  persistant dizziness or light-headedness   Complete by: As directed    Call MD for:  persistant nausea and vomiting   Complete by: As directed    Call MD for:  temperature >100.4   Complete by: As directed    Continue foley catheter   Complete by: As directed    Diet - low sodium heart healthy   Complete by: As directed    Increase activity slowly   Complete by: As directed      Allergies as of 06/17/2020      Reactions   Sulfa Antibiotics    Unknown reaction "long time ago" Other reaction(s):  Unknown Other reaction(s): UNKNOWN Unknown, happened when he was a child      Medication List    STOP taking these medications   memantine 5 MG tablet Commonly known as: NAMENDA   PRESERVISION AREDS 2 PO     TAKE  these medications   aspirin 81 MG tablet Take 81 mg by mouth daily.   atorvastatin 10 MG tablet Commonly known as: LIPITOR Take 10 mg by mouth daily.   cefdinir 300 MG capsule Commonly known as: OMNICEF Take 1 capsule (300 mg total) by mouth 2 (two) times daily for 9 days. Start taking on: June 18, 2020   donepezil 10 MG tablet Commonly known as: ARICEPT Take 10 mg by mouth daily.   doxazosin 2 MG tablet Commonly known as: CARDURA Take 2 mg by mouth at bedtime.   FLUoxetine 10 MG tablet Commonly known as: PROZAC Take 10 mg by mouth daily.       Follow-up Information    [facility]   Specialty: Home Health Services Why: They will follow up with you for your home health needs.        Sofie Hartigan, MD. Schedule an appointment as soon as possible for a visit in 1 week(s).   Specialty: Family Medicine Contact information: [place] [address] [phone]        Abbie Sons, MD. Schedule an appointment as soon as possible for a visit in 1 week(s).   Specialty: Urology Why: Continued urinary retention requiring replacement of Foley catheter Contact information: [place] [address] [phone]              Allergies  Allergen Reactions  . Sulfa Antibiotics     Unknown reaction "long time ago" Other reaction(s): Unknown Other reaction(s): UNKNOWN Unknown, happened when he was a child     Consultations:  None   Procedures/Studies: DG Chest 2 View  Result Date: 06/15/2020 CLINICAL DATA:  Fall EXAM: CHEST - 2 VIEW COMPARISON:  None. FINDINGS: The heart size and mediastinal contours are within normal limits. Both lungs are clear. The visualized skeletal structures are unremarkable. IMPRESSION: No active cardiopulmonary disease. Electronically Signed   By: Prudencio Pair M.D.   On: 06/15/2020 20:25   CT Head Wo Contrast  Result Date: 06/15/2020 CLINICAL DATA:   Altered mental status EXAM: CT HEAD WITHOUT CONTRAST TECHNIQUE: Contiguous axial images were obtained from the base of the skull through the vertex without intravenous contrast. COMPARISON:  May 27, 2020 FINDINGS: Brain: No evidence of acute territorial infarction, hemorrhage, hydrocephalus,extra-axial collection or mass lesion/mass effect. There is dilatation the ventricles and sulci consistent with age-related atrophy. Low-attenuation changes in the deep white matter consistent with small vessel ischemia. Vascular: No hyperdense vessel or unexpected calcification. Skull: The skull is intact. No fracture or focal lesion identified. Sinuses/Orbits: There is complete opacification of the bilateral maxillary sinuses, ethmoid air cells, sphenoid sinuses and frontal sinuses. Again noted is partial opacification of the left mastoid air cells Other: None IMPRESSION: No acute intracranial abnormality. Findings consistent with age related atrophy and chronic small vessel ischemia Chronic pan sinusitis Electronically Signed   By: Prudencio Pair M.D.   On: 06/15/2020 20:36   CT Head Wo Contrast  Result Date: 05/27/2020 CLINICAL DATA:  Status post fall today.  Initial encounter. EXAM: CT HEAD WITHOUT CONTRAST TECHNIQUE: Contiguous axial images were obtained from the base of the skull through the vertex without intravenous  contrast. COMPARISON:  Head CT 05/25/2020. FINDINGS: Brain: No evidence of acute infarction, hemorrhage, hydrocephalus, extra-axial collection or mass lesion/mass effect. Marked cortical atrophy and chronic microvascular ischemic change again seen. Vascular: No hyperdense vessel or unexpected calcification. Skull: Intact.  No focal lesion. Sinuses/Orbits: There is complete opacification of all the paranasal sinuses with wall thickening consistent with chronic change. Left mastoid effusion again seen. Other: None. IMPRESSION: No acute abnormality. Marked atrophy and chronic microvascular ischemic change.  Chronic pansinus disease.  Left mastoid effusion also noted. Electronically Signed   By: Inge Rise M.D.   On: 05/27/2020 19:34   CT Head Wo Contrast  Result Date: 05/25/2020 CLINICAL DATA:  Generalized weakness history of fall EXAM: CT HEAD WITHOUT CONTRAST TECHNIQUE: Contiguous axial images were obtained from the base of the skull through the vertex without intravenous contrast. COMPARISON:  Report 10/23/2002 FINDINGS: Brain: No acute territorial infarction, hemorrhage, or intracranial mass. Advanced atrophy. Moderate hypodensity in the white matter consistent with chronic small vessel ischemic change. Slightly enlarged ventricles felt secondary to atrophy. Vascular: No hyperdense vessels.  Carotid vascular calcification Skull: Normal. Negative for fracture or focal lesion. Sinuses/Orbits: Fluid in the left mastoid air cells. Completely opacified paranasal sinuses with slightly hyperdense secretions. Other: None IMPRESSION: 1. No CT evidence for acute intracranial abnormality. 2. Atrophy and chronic small vessel ischemic changes of the white matter. 3. Pansinusitis 4. Fluid in the left mastoid air cells. Electronically Signed   By: Donavan Foil M.D.   On: 05/25/2020 18:15   CT Cervical Spine Wo Contrast  Result Date: 06/15/2020 CLINICAL DATA:  Altered mental status EXAM: CT HEAD WITHOUT CONTRAST TECHNIQUE: Contiguous axial images were obtained from the base of the skull through the vertex without intravenous contrast. COMPARISON:  May 27, 2020 FINDINGS: Brain: No evidence of acute territorial infarction, hemorrhage, hydrocephalus,extra-axial collection or mass lesion/mass effect. There is dilatation the ventricles and sulci consistent with age-related atrophy. Low-attenuation changes in the deep white matter consistent with small vessel ischemia. Vascular: No hyperdense vessel or unexpected calcification. Skull: The skull is intact. No fracture or focal lesion identified. Sinuses/Orbits: There is  complete opacification of the bilateral maxillary sinuses, ethmoid air cells, sphenoid sinuses and frontal sinuses. Again noted is partial opacification of the left mastoid air cells Other: None IMPRESSION: No acute intracranial abnormality. Findings consistent with age related atrophy and chronic small vessel ischemia Chronic pan sinusitis Electronically Signed   By: Prudencio Pair M.D.   On: 06/15/2020 20:36   US Pelvis Limited  Result Date: 05/27/2020 CLINICAL DATA:  Repeat Foley catheter insertion. EXAM: LIMITED ULTRASOUND OF PELVIS TECHNIQUE: Limited transabdominal ultrasound examination of the pelvis was performed. COMPARISON:  Pelvic ultrasound 05/27/2020 at 0923 hours FINDINGS: The urinary bladder has a normal appearance for the degree of distention. No Foley catheter is visualized within the bladder. Bladder volume: 1,005 mL Other findings:  None. IMPRESSION: No Foley catheter visualized within the bladder. Unchanged bladder distension. Electronically Signed   By: Logan Bores M.D.   On: 05/27/2020 11:55   US Pelvis Limited  Result Date: 05/27/2020 CLINICAL DATA:  Evaluate bladder.  Blood clot noted Foley catheter. EXAM: LIMITED ULTRASOUND OF PELVIS TECHNIQUE: Limited transabdominal ultrasound examination of the pelvis was performed. COMPARISON:  None. FINDINGS: The urinary bladder has a normal appearance for the degree of distention. No focal abnormality noted. Additionally, no Foley catheter identified within the lumen of the bladder. Pre-void volume: 1058.38 cc Post-void volume: Patient unable to void. Other findings:  None. IMPRESSION: 1.  Distended urinary bladder with a volume of 1058 cc. No focal bladder abnormality identified to explain clots in Foley catheter. 2. Foley catheter not visualized within the lumen of the bladder. Recommend confirmation of catheter placement. Electronically Signed   By: Kerby Moors M.D.   On: 05/27/2020 09:56   CT ABDOMEN PELVIS W CONTRAST  Result Date:  05/25/2020 CLINICAL DATA:  Lower abdominal pain and distention. EXAM: CT ABDOMEN AND PELVIS WITH CONTRAST TECHNIQUE: Multidetector CT imaging of the abdomen and pelvis was performed using the standard protocol following bolus administration of intravenous contrast. CONTRAST:  133mL OMNIPAQUE IOHEXOL 300 MG/ML  SOLN COMPARISON:  None. FINDINGS: Lower chest: No acute abnormality. Hepatobiliary: No focal liver abnormality is seen. No gallstones, gallbladder wall thickening, or biliary dilatation. Pancreas: Unremarkable. No pancreatic ductal dilatation or surrounding inflammatory changes. Spleen: Normal in size without focal abnormality. Adrenals/Urinary Tract: Adrenal glands appear normal. Bilateral renal cysts are noted. Small nonobstructive left renal calculus is noted. No hydronephrosis or renal obstruction is noted. Urinary bladder is decompressed secondary to Foley catheter. Stomach/Bowel: The stomach appears normal. There is no evidence of bowel obstruction or inflammation. Vascular/Lymphatic: Aortic atherosclerosis. No enlarged abdominal or pelvic lymph nodes. Reproductive: Prostate is unremarkable. Other: No abdominal wall hernia or abnormality. No abdominopelvic ascites. Musculoskeletal: No acute or significant osseous findings. IMPRESSION: 1. Bilateral renal cysts. 2. Small nonobstructive left renal calculus. No hydronephrosis or renal obstruction is noted. 3. No other abnormality seen in the abdomen or pelvis. Aortic Atherosclerosis (ICD10-I70.0). Electronically Signed   By: Marijo Conception M.D.   On: 05/25/2020 18:20      Subjective: Patient seen and examined bedside, resting comfortably.  Pleasantly confused.  Family present.  Family states he is close to his normal baseline.  Discussed with him that he will need close follow-up with his urologist regarding recurrent urinary retention.  No other questions or concerns at this time.  Patient denies headache, no chest pain, no shortness of breath, no  abdominal pain.  No acute events overnight per nursing staff.  Discharge Exam: Vitals:   06/17/20 0328 06/17/20 0813  BP: (!) 159/76 (!) 148/75  Pulse: 71 75  Resp: 18 (!) 24  Temp: 97.8 F (36.6 C) 98.9 F (37.2 C)  SpO2: 94% 94%   Vitals:   06/16/20 1932 06/16/20 2327 06/17/20 0328 06/17/20 0813  BP: 127/63 137/71 (!) 159/76 (!) 148/75  Pulse: 79 76 71 75  Resp: 20 16 18  (!) 24  Temp: 99.4 F (37.4 C) 98.7 F (37.1 C) 97.8 F (36.6 C) 98.9 F (37.2 C)  TempSrc: Oral Oral Oral Oral  SpO2: 94% 93% 94% 94%  Weight:      Height:        General: Pt is alert, awake, not in acute distress Cardiovascular: RRR, S1/S2 +, no rubs, no gallops Respiratory: CTA bilaterally, no wheezing, no rhonchi GU: Foley catheter noted in place, clear yellow urine Abdominal: Soft, NT, ND, bowel sounds + Extremities: no edema, no cyanosis    The results of significant diagnostics from this hospitalization (including imaging, microbiology, ancillary and laboratory) are listed below for reference.     Microbiology: Recent Results (from the past 240 hour(s))  Urine Culture     Status: None (Preliminary result)   Collection Time: 06/15/20  9:39 PM   Specimen: Urine, Random  Result Value Ref Range Status   Specimen Description   Final    URINE, RANDOM Performed at [facility], [place], [place]  [address]    Special Requests   Final    NONE Performed at [facility], [address], [place], [place] [address]    Culture   Final    CULTURE REINCUBATED FOR BETTER GROWTH Performed at [facility], [place] [address], [place], [place] [address]    Report Status PENDING  Incomplete  Resp Panel by RT-PCR (Flu A&B, Covid) Nasopharyngeal Swab     Status: None   Collection Time: 06/15/20  9:51 PM   Specimen: Nasopharyngeal Swab; Nasopharyngeal(NP) swabs in vial transport medium  Result Value Ref Range Status   SARS Coronavirus 2 by RT PCR NEGATIVE NEGATIVE  Final    Comment: (NOTE) SARS-CoV-2 target nucleic acids are NOT DETECTED.  The SARS-CoV-2 RNA is generally detectable in upper respiratory specimens during the acute phase of infection. The lowest concentration of SARS-CoV-2 viral copies this assay can detect is 138 copies/mL. A negative result does not preclude SARS-Cov-2 infection and should not be used as the sole basis for treatment or other patient management decisions. A negative result may occur with  improper specimen collection/handling, submission of specimen other than nasopharyngeal swab, presence of viral mutation(s) within the areas targeted by this assay, and inadequate number of viral copies(<138 copies/mL). A negative result must be combined with clinical observations, patient history, and epidemiological information. The expected result is Negative.  Fact Sheet for Patients:  EntrepreneurPulse.com.au  Fact Sheet for Healthcare Providers:  IncredibleEmployment.be  This test is no t yet approved or cleared by the [place] FDA and  has been authorized for detection and/or diagnosis of SARS-CoV-2 by FDA under an Emergency Use Authorization (EUA). This EUA will remain  in effect (meaning this test can be used) for the duration of the COVID-19 declaration under Section 564(b)(1) of the Act, 21 U.S.C.section 360bbb-3(b)(1), unless the authorization is terminated  or revoked sooner.       Influenza A by PCR NEGATIVE NEGATIVE Final   Influenza B by PCR NEGATIVE NEGATIVE Final    Comment: (NOTE) The Xpert Xpress SARS-CoV-2/FLU/RSV plus assay is intended as an aid in the diagnosis of influenza from Nasopharyngeal swab specimens and should not be used as a sole basis for treatment. Nasal washings and aspirates are unacceptable for Xpert Xpress SARS-CoV-2/FLU/RSV testing.  Fact Sheet for Patients: EntrepreneurPulse.com.au  Fact Sheet for Healthcare  Providers: IncredibleEmployment.be  This test is not yet approved or cleared by the [place] FDA and has been authorized for detection and/or diagnosis of SARS-CoV-2 by FDA under an Emergency Use Authorization (EUA). This EUA will remain in effect (meaning this test can be used) for the duration of the COVID-19 declaration under Section 564(b)(1) of the Act, 21 U.S.C. section 360bbb-3(b)(1), unless the authorization is terminated or revoked.  Performed at [facility], [address] [place], [place] [address]      Labs: BNP (last 3 results) No results for input(s): BNP in the last 8760 hours. Basic Metabolic Panel: Recent Labs  Lab 06/15/20 1948 06/16/20 0613  NA 138 139  K 4.0 3.7  CL 106 106  CO2 23 25  GLUCOSE 119* 97  BUN 20 20  CREATININE 1.01 1.01  CALCIUM 8.9 8.8*   Liver Function Tests: Recent Labs  Lab 06/15/20 1948  AST 20  ALT 17  ALKPHOS 105  BILITOT 1.1  PROT 7.3  ALBUMIN 3.3*   No results for input(s): LIPASE, AMYLASE in the last 168 hours. Recent Labs  Lab 06/15/20 2151  AMMONIA 12  CBC: Recent Labs  Lab 06/15/20 1948 06/16/20 0613  WBC 14.1* 11.7*  NEUTROABS 11.7*  --   HGB 12.9* 11.8*  HCT 38.1* 35.4*  MCV 90.3 90.8  PLT 210 198   Cardiac Enzymes: No results for input(s): CKTOTAL, CKMB, CKMBINDEX, TROPONINI in the last 168 hours. BNP: Invalid input(s): POCBNP CBG: No results for input(s): GLUCAP in the last 168 hours. D-Dimer No results for input(s): DDIMER in the last 72 hours. Hgb A1c No results for input(s): HGBA1C in the last 72 hours. Lipid Profile No results for input(s): CHOL, HDL, LDLCALC, TRIG, CHOLHDL, LDLDIRECT in the last 72 hours. Thyroid function studies Recent Labs    06/15/20 1948  TSH 4.335   Anemia work up No results for input(s): VITAMINB12, FOLATE, FERRITIN, TIBC, IRON, RETICCTPCT in the last 72 hours. Urinalysis    Component Value Date/Time   COLORURINE YELLOW  (A) 06/15/2020 2139   APPEARANCEUR TURBID (A) 06/15/2020 2139   LABSPEC 1.017 06/15/2020 2139   PHURINE 6.0 06/15/2020 2139   GLUCOSEU NEGATIVE 06/15/2020 2139   HGBUR MODERATE (A) 06/15/2020 2139   BILIRUBINUR NEGATIVE 06/15/2020 2139   KETONESUR 5 (A) 06/15/2020 2139   PROTEINUR 100 (A) 06/15/2020 2139   NITRITE POSITIVE (A) 06/15/2020 2139   LEUKOCYTESUR MODERATE (A) 06/15/2020 2139   Sepsis Labs Invalid input(s): PROCALCITONIN,  WBC,  LACTICIDVEN Microbiology Recent Results (from the past 240 hour(s))  Urine Culture     Status: None (Preliminary result)   Collection Time: 06/15/20  9:39 PM   Specimen: Urine, Random  Result Value Ref Range Status   Specimen Description   Final    URINE, RANDOM Performed at [facility], [address] [place], [place] [address]    Special Requests   Final    NONE Performed at [facility], [address], [place], [place] [address]    Culture   Final    CULTURE REINCUBATED FOR BETTER GROWTH Performed at [facility], [place] [address] [place], [place] [address]    Report Status PENDING  Incomplete  Resp Panel by RT-PCR (Flu A&B, Covid) Nasopharyngeal Swab     Status: None   Collection Time: 06/15/20  9:51 PM   Specimen: Nasopharyngeal Swab; Nasopharyngeal(NP) swabs in vial transport medium  Result Value Ref Range Status   SARS Coronavirus 2 by RT PCR NEGATIVE NEGATIVE Final    Comment: (NOTE) SARS-CoV-2 target nucleic acids are NOT DETECTED.  The SARS-CoV-2 RNA is generally detectable in upper respiratory specimens during the acute phase of infection. The lowest concentration of SARS-CoV-2 viral copies this assay can detect is 138 copies/mL. A negative result does not preclude SARS-Cov-2 infection and should not be used as the sole basis for treatment or other patient management decisions. A negative result may occur with  improper specimen collection/handling, submission of specimen other than  nasopharyngeal swab, presence of viral mutation(s) within the areas targeted by this assay, and inadequate number of viral copies(<138 copies/mL). A negative result must be combined with clinical observations, patient history, and epidemiological information. The expected result is Negative.  Fact Sheet for Patients:  EntrepreneurPulse.com.au  Fact Sheet for Healthcare Providers:  IncredibleEmployment.be  This test is no t yet approved or cleared by the [place] FDA and  has been authorized for detection and/or diagnosis of SARS-CoV-2 by FDA under an Emergency Use Authorization (EUA). This EUA will remain  in effect (meaning this test can be used) for the duration of the COVID-19 declaration under Section 564(b)(1)  of the Act, 21 U.S.C.section 360bbb-3(b)(1), unless the authorization is terminated  or revoked sooner.       Influenza A by PCR NEGATIVE NEGATIVE Final   Influenza B by PCR NEGATIVE NEGATIVE Final    Comment: (NOTE) The Xpert Xpress SARS-CoV-2/FLU/RSV plus assay is intended as an aid in the diagnosis of influenza from Nasopharyngeal swab specimens and should not be used as a sole basis for treatment. Nasal washings and aspirates are unacceptable for Xpert Xpress SARS-CoV-2/FLU/RSV testing.  Fact Sheet for Patients: EntrepreneurPulse.com.au  Fact Sheet for Healthcare Providers: IncredibleEmployment.be  This test is not yet approved or cleared by the [place] FDA and has been authorized for detection and/or diagnosis of SARS-CoV-2 by FDA under an Emergency Use Authorization (EUA). This EUA will remain in effect (meaning this test can be used) for the duration of the COVID-19 declaration under Section 564(b)(1) of the Act, 21 U.S.C. section 360bbb-3(b)(1), unless the authorization is terminated or revoked.  Performed at [facility], [address], [place], [place]  [address]      Time coordinating discharge: Over 30 minutes  SIGNED:   Paloma Grange J [place] DO  [facility] 06/17/2020, 10:15 AM

## 2020-06-17 NOTE — Care Management Obs Status (Addendum)
[facility] NOTIFICATION   Patient Details  Name: ZYREN SEVIGNY MRN: [id number] Date of Birth: 10/17/1930   Medicare Observation Status Notification Given:  Yes  Patient only AOx2. Reviewed with wife and she signed.    Candie Chroman, LCSW 06/17/2020, 9:10 AM

## 2020-06-17 NOTE — Discharge Instructions (Signed)
Urinary Tract Infection, Adult  A urinary tract infection (UTI) is an infection of any part of the urinary tract. The urinary tract includes the kidneys, ureters, bladder, and urethra. These organs make, store, and get rid of urine in the body. An upper UTI affects the ureters and kidneys. A lower UTI affects the bladder and urethra. What are the causes? Most urinary tract infections are caused by bacteria in your genital area around your urethra, where urine leaves your body. These bacteria grow and cause inflammation of your urinary tract. What increases the risk? You are more likely to develop this condition if:  You have a urinary catheter that stays in place.  You are not able to control when you urinate or have a bowel movement (incontinence).  You are male and you: ? Use a spermicide or diaphragm for birth control. ? Have low estrogen levels. ? Are pregnant.  You have certain genes that increase your risk.  You are sexually active.  You take antibiotic medicines.  You have a condition that causes your flow of urine to slow down, such as: ? An enlarged prostate, if you are male. ? Blockage in your urethra. ? A kidney stone. ? A nerve condition that affects your bladder control (neurogenic bladder). ? Not getting enough to drink, or not urinating often.  You have certain medical conditions, such as: ? Diabetes. ? A weak disease-fighting system (immunesystem). ? Sickle cell disease. ? Gout. ? Spinal cord injury. What are the signs or symptoms? Symptoms of this condition include:  Needing to urinate right away (urgency).  Frequent urination. This may include small amounts of urine each time you urinate.  Pain or burning with urination.  Blood in the urine.  Urine that smells bad or unusual.  Trouble urinating.  Cloudy urine.  Vaginal discharge, if you are male.  Pain in the abdomen or the lower back. You may also have:  Vomiting or a decreased  appetite.  Confusion.  Irritability or tiredness.  A fever or chills.  Diarrhea. The first symptom in older adults may be confusion. In some cases, they may not have any symptoms until the infection has worsened. How is this diagnosed? This condition is diagnosed based on your medical history and a physical exam. You may also have other tests, including:  Urine tests.  Blood tests.  Tests for STIs (sexually transmitted infections). If you have had more than one UTI, a cystoscopy or imaging studies may be done to determine the cause of the infections. How is this treated? Treatment for this condition includes:  Antibiotic medicine.  Over-the-counter medicines to treat discomfort.  Drinking enough water to stay hydrated. If you have frequent infections or have other conditions such as a kidney stone, you may need to see a health care provider who specializes in the urinary tract (urologist). In rare cases, urinary tract infections can cause sepsis. Sepsis is a life-threatening condition that occurs when the body responds to an infection. Sepsis is treated in the hospital with IV antibiotics, fluids, and other medicines. Follow these instructions at home: Medicines  Take over-the-counter and prescription medicines only as told by your health care provider.  If you were prescribed an antibiotic medicine, take it as told by your health care provider. Do not stop using the antibiotic even if you start to feel better. General instructions  Make sure you: ? Empty your bladder often and completely. Do not hold urine for long periods of time. ? Empty your bladder after   sex. ? Wipe from front to back after urinating or having a bowel movement if you are male. Use each tissue only one time when you wipe.  Drink enough fluid to keep your urine pale yellow.  Keep all follow-up visits. This is important.   Contact a health care provider if:  Your symptoms do not get better after 1-2  days.  Your symptoms go away and then return. Get help right away if:  You have severe pain in your back or your lower abdomen.  You have a fever or chills.  You have nausea or vomiting. Summary  A urinary tract infection (UTI) is an infection of any part of the urinary tract, which includes the kidneys, ureters, bladder, and urethra.  Most urinary tract infections are caused by bacteria in your genital area.  Treatment for this condition often includes antibiotic medicines.  If you were prescribed an antibiotic medicine, take it as told by your health care provider. Do not stop using the antibiotic even if you start to feel better.  Keep all follow-up visits. This is important. This information is not intended to replace advice given to you by your health care provider. Make sure you discuss any questions you have with your health care provider. Document Revised: 10/09/2019 Document Reviewed: 10/09/2019 Elsevier Patient Education  2021 [place], Adult An indwelling urinary catheter is a thin, flexible, germ-free (sterile) tube that is placed into the bladder to help drain urine out of the body. The catheter is inserted into the part of the body that drains urine from the bladder (urethra). Urine drains from the catheter into a drainage bag outside of the body. Taking good care of your catheter will keep it working properly and help to prevent problems from developing. What are the risks?  Bacteria may get into your bladder and cause a urinary tract infection.  Urine flow can become blocked. This can happen if the catheter is not working correctly, or if you have sediment or a blood clot in your bladder or the catheter.  Tissue near the catheter may become irritated and bleed. How to wear your catheter and your drainage bag Supplies needed  Adhesive tape or a leg strap.  Alcohol wipe or soap and water (if you use tape).  A clean towel  (if you use tape).  Overnight drainage bag.  Smaller drainage bag (leg bag). Wearing your catheter and bag Use adhesive tape or a leg strap to attach your catheter to your leg.  Make sure the catheter is not pulled tight.  If a leg strap gets wet, replace it with a dry one.  If you use adhesive tape: 1. Use an alcohol wipe or soap and water to wash off any stickiness on your skin where you had tape before. 2. Use a clean towel to pat-dry the area. 3. Apply the new tape. You should have received a large overnight drainage bag and a smaller leg bag that fits underneath clothing.  You may wear the overnight bag at any time, but you should not wear the leg bag at night.  Always wear the leg bag below your knee.  Make sure the overnight drainage bag is always lower than the level of your bladder, but do not let it touch the floor. Before you go to sleep, hang the bag inside a wastebasket that is covered by a clean plastic bag. How to care for your skin around the catheter Supplies needed  A  clean washcloth.  Water and mild soap.  A clean towel. Caring for your skin and catheter  Every day, use a clean washcloth and soapy water to clean the skin around your catheter. 1. Wash your hands with soap and water. 2. Wet a washcloth in warm water and mild soap. 3. Clean the skin around your urethra.  If you are male:  Use one hand to gently spread the folds of skin around your vagina (labia).  With the washcloth in your other hand, wipe the inner side of your labia on each side. Do this in a front-to-back direction.  If you are male:  Use one hand to pull back any skin that covers the end of your penis (foreskin).  With the washcloth in your other hand, wipe your penis in small circles. Start wiping at the tip of your penis, then move outward from the catheter.  Move the foreskin back in place, if this applies. 4. With your free hand, hold the catheter close to where it enters  your body. Keep holding the catheter during cleaning so it does not get pulled out. 5. Use your other hand to clean the catheter with the washcloth.  Only wipe downward on the catheter.  Do not wipe upward toward your body, because that may push bacteria into your urethra and cause infection. 6. Use a clean towel to pat-dry the catheter and the skin around it. Make sure to wipe off all soap. 7. Wash your hands with soap and water.  Shower every day. Do not take baths.  Do not use cream, ointment, or lotion on the area where the catheter enters your body, unless your health care provider tells you to do that.  Do not use powders, sprays, or lotions on your genital area.  Check your skin around the catheter every day for signs of infection. Check for: ? Redness, swelling, or pain. ? Fluid or blood. ? Warmth. ? Pus or a bad smell.      How to empty the drainage bag Supplies needed  Rubbing alcohol.  Gauze pad or cotton ball.  Adhesive tape or a leg strap. Emptying the bag Empty your drainage bag (your overnight drainage bag or your leg bag) when it is ?- full, or at least 2-3 times a day. Clean the drainage bag according to the manufacturer's instructions or as told by your health care provider. 1. Wash your hands with soap and water. 2. Detach the drainage bag from your leg. 3. Hold the drainage bag over the toilet or a clean container. Make sure the drainage bag is lower than your hips and bladder. This stops urine from going back into the tubing and into your bladder. 4. Open the pour spout at the bottom of the bag. 5. Empty the urine into the toilet or container. Do not let the pour spout touch any surface. This precaution is important to prevent bacteria from getting in the bag and causing infection. 6. Apply rubbing alcohol to a gauze pad or cotton ball. 7. Use the gauze pad or cotton ball to clean the pour spout. 8. Close the pour spout. 9. Attach the bag to your leg with  adhesive tape or a leg strap. 10. Wash your hands with soap and water. How to change the drainage bag Supplies needed:  Alcohol wipes.  A clean drainage bag.  Adhesive tape or a leg strap. Changing the bag Replace your drainage bag with a clean bag if it leaks, starts to smell  bad, or looks dirty. 1. Wash your hands with soap and water. 2. Detach the dirty drainage bag from your leg. 3. Pinch the catheter with your fingers so that urine does not spill out. 4. Disconnect the catheter tube from the drainage tube at the connection valve. Do not let the tubes touch any surface. 5. Clean the end of the catheter tube with an alcohol wipe. Use a different alcohol wipe to clean the end of the drainage tube. 6. Connect the catheter tube to the drainage tube of the clean bag. 7. Attach the clean bag to your leg with adhesive tape or a leg strap. Avoid attaching the new bag too tightly. 8. Wash your hands with soap and water. General instructions  Never pull on your catheter or try to remove it. Pulling can damage your internal tissues.  Always wash your hands before and after you handle your catheter or drainage bag. Use a mild, fragrance-free soap. If soap and water are not available, use hand sanitizer.  Always make sure there are no twists or bends (kinks) in the catheter tube.  Always make sure there are no leaks in the catheter or drainage bag.  Drink enough fluid to keep your urine pale yellow.  Do not take baths, swim, or use a hot tub.  If you are male, wipe from front to back after having a bowel movement.   Contact a health care provider if:  Your urine is cloudy.  Your urine smells unusually bad.  Your catheter gets clogged.  Your catheter starts to leak.  Your bladder feels full. Get help right away if:  You have redness, swelling, or pain where the catheter enters your body.  You have fluid, blood, pus, or a bad smell coming from the area where the catheter  enters your body.  The area where the catheter enters your body feels warm to the touch.  You have a fever.  You have pain in your abdomen, legs, lower back, or bladder.  You see blood in the catheter.  Your urine is pink or red.  You have nausea, vomiting, or chills.  Your urine is not draining into the bag.  Your catheter gets pulled out. Summary  An indwelling urinary catheter is a thin, flexible, germ-free (sterile) tube that is placed into the bladder to help drain urine out of the body.  The catheter is inserted into the part of the body that drains urine from the bladder (urethra).  Take good care of your catheter to keep it working properly and help prevent problems from developing.  Always wash your hands before and after you handle your catheter or drainage bag.  Never pull on your catheter or try to remove it. This information is not intended to replace advice given to you by your health care provider. Make sure you discuss any questions you have with your health care provider. Document Revised: 05/04/2019 Document Reviewed: 10/12/2016 Elsevier Patient Education  [place].

## 2020-06-17 NOTE — Progress Notes (Signed)
Discharge instructions reviewed with wife and taught how to change out foley bag and leg bag

## 2020-06-17 NOTE — TOC Progression Note (Signed)
Transition of Care [facility]) - Progression Note    Patient Details  Name: Randall Schneider MRN: [id number] Date of Birth: 1930-12-15  Transition of Care [facility]) CM/SW [place], LCSW Phone Number: 06/17/2020, 9:12 AM  Clinical Narrative:  Wife is interested in eventual long-term SNF placement. Patient does not have Medicaid but wife said her lawyer has told her he will eventually need to start the process. [facility] can add a social worker to services to assist with this process. Wife confirmed hospital bed was delivered to the home yesterday.   Expected Discharge Plan: [place] Barriers to Discharge: Continued Medical Work up  Expected Discharge Plan and Services Expected Discharge Plan: [place] Choice: Resumption of Svcs/PTA Provider Living arrangements for the past 2 months: [place]: RN,PT [place] Agency: [place] ([facility]) Date [place]: 06/16/20   Representative spoke with at [place]: Floydene Flock   Social Determinants of Health (SDOH) Interventions    Readmission Risk Interventions No flowsheet data found.

## 2020-06-17 NOTE — TOC Transition Note (Signed)
Transition of Care [facility]) - CM/SW Discharge Note   Patient Details  Name: Randall Schneider MRN: [id number] Date of Birth: 1930-12-08  Transition of Care [facility]) CM/SW Contact:  Candie Chroman, LCSW Phone Number: 06/17/2020, 12:51 PM   Clinical Narrative:  Patient has orders to discharge home today. [place] representative is aware. No further concerns. CSW signing off.   Final next level of care: [place] Barriers to Discharge: Barriers Resolved   Patient Goals and CMS Choice        Discharge Placement                  Name of family member notified: Erin Uecker Patient and family notified of of transfer: 06/17/20  Discharge Plan and Services     Post Acute Care Choice: Resumption of Svcs/PTA Provider                    HH Arranged: RN,PT,OT,Social Work [facility] Agency: [facility] ([facility]) Date [place]: 06/17/20   Representative spoke with at [place]: Floydene Flock  Social Determinants of Health (SDOH) Interventions     Readmission Risk Interventions No flowsheet data found.

## 2020-06-17 NOTE — Plan of Care (Signed)
  Problem: Education: Goal: Knowledge of General Education information will improve Description: Including pain rating scale, medication(s)/side effects and non-pharmacologic comfort measures 06/17/2020 1124 by Nevaeh Casillas, Winifred Olive, RN Outcome: Adequate for Discharge 06/17/2020 0745 by Vivien Rota, RN Outcome: Progressing   Problem: Health Behavior/Discharge Planning: Goal: Ability to manage health-related needs will improve 06/17/2020 1124 by Kourtney Terriquez, Winifred Olive, RN Outcome: Adequate for Discharge 06/17/2020 0745 by Vivien Rota, RN Outcome: Progressing   Problem: Clinical Measurements: Goal: Ability to maintain clinical measurements within normal limits will improve 06/17/2020 1124 by Vivien Rota, RN Outcome: Adequate for Discharge 06/17/2020 0745 by Vivien Rota, RN Outcome: Progressing Goal: Will remain free from infection 06/17/2020 1124 by Vivien Rota, RN Outcome: Adequate for Discharge 06/17/2020 0745 by Vivien Rota, RN Outcome: Progressing Goal: Diagnostic test results will improve 06/17/2020 1124 by Vivien Rota, RN Outcome: Adequate for Discharge 06/17/2020 0745 by Vivien Rota, RN Outcome: Progressing Goal: Respiratory complications will improve 06/17/2020 1124 by Vivien Rota, RN Outcome: Adequate for Discharge 06/17/2020 0745 by Vivien Rota, RN Outcome: Progressing Goal: Cardiovascular complication will be avoided 06/17/2020 1124 by Vivien Rota, RN Outcome: Adequate for Discharge 06/17/2020 0745 by Vivien Rota, RN Outcome: Progressing   Problem: Activity: Goal: Risk for activity intolerance will decrease 06/17/2020 1124 by Vivien Rota, RN Outcome: Adequate for Discharge 06/17/2020 0745 by Vivien Rota, RN Outcome: Progressing   Problem: Nutrition: Goal: Adequate nutrition will be maintained 06/17/2020 1124 by Vivien Rota, RN Outcome: Adequate for  Discharge 06/17/2020 0745 by Vivien Rota, RN Outcome: Progressing   Problem: Coping: Goal: Level of anxiety will decrease 06/17/2020 1124 by Vivien Rota, RN Outcome: Adequate for Discharge 06/17/2020 0745 by Vivien Rota, RN Outcome: Progressing   Problem: Elimination: Goal: Will not experience complications related to bowel motility 06/17/2020 1124 by Vivien Rota, RN Outcome: Adequate for Discharge 06/17/2020 0745 by Vivien Rota, RN Outcome: Progressing Goal: Will not experience complications related to urinary retention 06/17/2020 1124 by Vivien Rota, RN Outcome: Adequate for Discharge 06/17/2020 0745 by Vivien Rota, RN Outcome: Progressing   Problem: Pain Managment: Goal: General experience of comfort will improve 06/17/2020 1124 by Vivien Rota, RN Outcome: Adequate for Discharge 06/17/2020 0745 by Vivien Rota, RN Outcome: Progressing   Problem: Safety: Goal: Ability to remain free from injury will improve 06/17/2020 1124 by Vivien Rota, RN Outcome: Adequate for Discharge 06/17/2020 0745 by Vivien Rota, RN Outcome: Progressing   Problem: Skin Integrity: Goal: Risk for impaired skin integrity will decrease 06/17/2020 1124 by Vivien Rota, RN Outcome: Adequate for Discharge 06/17/2020 0745 by Vivien Rota, RN Outcome: Progressing

## 2020-06-19 LAB — URINE CULTURE: Culture: >=100000 — AB

## 2020-06-22 ENCOUNTER — Ambulatory Visit: Payer: Medicare Other | Admitting: Urology

## 2020-06-22 ENCOUNTER — Other Ambulatory Visit: Payer: Self-pay

## 2020-06-22 ENCOUNTER — Telehealth: Payer: Self-pay

## 2020-06-22 DIAGNOSIS — R339 Retention of urine, unspecified: Secondary | ICD-10-CM

## 2020-06-22 LAB — BLADDER SCAN AMB NON-IMAGING

## 2020-06-22 NOTE — Telephone Encounter (Signed)
Tiffany from [place] [phone] opt #2) called stating that patient's wife notified them that patient pulled out his catheter this morning. He is having minor bleeding and is not voiding on his own but is not in pain and belly is not distended. She would like to know if we would give an order for cath replacement or how we would like to proceed.  Per Dr. Bernardo Heater patient to be added on to PA schedule this afternoon for a PVR and possible cath replacement if needed.  Called and spoke with patient's wife and scheduled appointment. Tiffany also notified

## 2020-06-22 NOTE — Progress Notes (Signed)
06/22/2020 10:18 AM   Randall Schneider 11-03-1930 [id number]  Referring provider: Sofie Hartigan, MD Randall Schneider,  [place] [id number]  Chief Complaint  Patient presents with  . Urinary Retention   Urological history: 1. Urinary retention -recent episode on 05/25/2020 with 1500 mL of urine -catheter replaced 06/16/2020  2. BPH with LU TS -s/p TURP > 10 years ago -managed with doxazosin 2 mg daily   3. Incomplete bladder emptying -hx of PVR's of 200-500 cc   HPI: Randall Schneider is a 87 y.o. male who presents today after a traumatic Foley removal this morning with his wife, Randall Schneider.    His wife states that he may have gotten the catheter tangled up in clothing rather than he purposely pulling it out.  He was having minor bleeding, but he was not able to urinate on his own.  He presents this afternoon and bladder scan noted a PVR of >591 cc.  Patient denies any modifying or aggravating factors.  Patient denies and dysuria or suprapubic/flank pain.  Patient denies any fevers, chills, nausea or vomiting.    PMH: Past Medical History:  Diagnosis Date  . Arthritis   . HOH (hard of hearing)   . Hypercholesteremia   . Hypertension     Surgical History: Past Surgical History:  Procedure Laterality Date  . CATARACT EXTRACTION W/PHACO Right 08/06/2018   Procedure: CATARACT EXTRACTION PHACO AND INTRAOCULAR LENS PLACEMENT ([place]) RIGHT DIABETES;  Surgeon: Randall Koyanagi, MD;  Location: [facility];  Service: Ophthalmology;  Laterality: Right;  . CIRCUMCISION, NON-NEWBORN  05/07/2013  . HERNIA REPAIR    . TONSILLECTOMY     and adenoids  . TRANSURETHRAL RESECTION OF PROSTATE  05/07/2013    Home Medications:  Allergies as of 06/22/2020      Reactions   Sulfa Antibiotics    Unknown reaction "long time ago" Other reaction(s): Unknown Other reaction(s): UNKNOWN Unknown, happened when he was a child      Medication List       Accurate as of June 22, 2020 11:59 PM. If you have any questions, ask your nurse or doctor.        aspirin 81 MG tablet Take 81 mg by mouth daily.   atorvastatin 10 MG tablet Commonly known as: LIPITOR Take 10 mg by mouth daily.   cefdinir 300 MG capsule Commonly known as: OMNICEF Take 1 capsule (300 mg total) by mouth 2 (two) times daily for 9 days.   donepezil 10 MG tablet Commonly known as: ARICEPT Take 10 mg by mouth daily.   doxazosin 2 MG tablet Commonly known as: CARDURA Take 2 mg by mouth at bedtime.   FLUoxetine 10 MG tablet Commonly known as: PROZAC Take 10 mg by mouth daily.       Allergies:  Allergies  Allergen Reactions  . Sulfa Antibiotics     Unknown reaction "long time ago" Other reaction(s): Unknown Other reaction(s): UNKNOWN Unknown, happened when he was a child     Family History: No family history on file.  Social History:  reports that he quit smoking about 32 years ago. He has never used smokeless tobacco. He reports current alcohol use. He reports that he does not use drugs.  ROS: Pertinent ROS in HPI  Physical Exam: There were no vitals taken for this visit.  Constitutional:  Well nourished. Alert and oriented, No acute distress. HEENT:  AT, mask in place.  Trachea midline Cardiovascular: No clubbing, cyanosis, or  edema. Respiratory: Normal respiratory effort, no increased work of breathing. Neurologic: Grossly intact, no focal deficits, moving all 4 extremities. Psychiatric: Normal mood and affect.  Laboratory Data: Lab Results  Component Value Date   WBC 11.7 (H) 06/16/2020   HGB 11.8 (L) 06/16/2020   HCT 35.4 (L) 06/16/2020   MCV 90.8 06/16/2020   PLT 198 06/16/2020    Lab Results  Component Value Date   CREATININE 1.01 06/16/2020    Lab Results  Component Value Date   TSH 4.335 06/15/2020    Lab Results  Component Value Date   AST 20 06/15/2020   Lab Results  Component Value Date   ALT 17 06/15/2020    Urinalysis     Component Value Date/Time   COLORURINE YELLOW (A) 06/15/2020 2139   APPEARANCEUR TURBID (A) 06/15/2020 2139   LABSPEC 1.017 06/15/2020 2139   PHURINE 6.0 06/15/2020 2139   GLUCOSEU NEGATIVE 06/15/2020 2139   HGBUR MODERATE (A) 06/15/2020 2139   BILIRUBINUR NEGATIVE 06/15/2020 2139   KETONESUR 5 (A) 06/15/2020 2139   PROTEINUR 100 (A) 06/15/2020 2139   NITRITE POSITIVE (A) 06/15/2020 2139   LEUKOCYTESUR MODERATE (A) 06/15/2020 2139    I have reviewed the labs.   Pertinent Imaging: Results for LUCKY, TROTTA (MRN [id number]) as of 06/23/2020 10:14  Ref. Range 06/22/2020 14:04  Scan Result Unknown >544mL    Simple Catheter Placement Due to urinary retention patient is present today for a foley cath placement.  Patient was cleaned and prepped in a sterile fashion with betadine. An 31 FR foley catheter was inserted, urine return was noted  500 ml, urine was dark yellow in color with a scat amount of clot.   The balloon was filled with 10cc of sterile water.  Phallus was cleaned and foreskin was pulled over the glans.  A leg bag was attached for drainage. Patient was also given a night bag to take home and was given instruction on how to change from one bag to another.  Patient was given instruction on proper catheter care.  Patient tolerated well, no complications were noted   Performed by: Myself and Randall Schneider, CMA  Assessment & Plan:    1. Urinary retention -Bladder Scan (Post Void Residual) in office -Foley catheter in place -Explained to the wife they may or may not see blood in the urine as it was a traumatic Foley removal, but if it becomes excessive or with large clots or clots off the catheter to contact us immediately   Return for Keep appointment with Sam on April 19.  These notes generated with voice recognition software. I apologize for typographical errors.  Randall Council, PA-C  [facility] [address]  [place],  [address] [phone]  I spent 30 minutes on the day of the encounter to include pre-visit record review, face-to-face time with the patient, and post-visit ordering of tests.

## 2020-06-23 ENCOUNTER — Encounter: Payer: Self-pay | Admitting: Urology

## 2020-06-28 ENCOUNTER — Ambulatory Visit: Payer: Medicare Other | Admitting: Physician Assistant

## 2020-06-28 ENCOUNTER — Other Ambulatory Visit: Payer: Self-pay

## 2020-06-28 ENCOUNTER — Encounter: Payer: Self-pay | Admitting: Physician Assistant

## 2020-06-28 VITALS — BP 91/58 | HR 83 | Temp 97.7°F | Ht 68.0 in | Wt 200.0 lb

## 2020-06-28 DIAGNOSIS — B965 Pseudomonas (aeruginosa) (mallei) (pseudomallei) as the cause of diseases classified elsewhere: Secondary | ICD-10-CM | POA: Diagnosis not present

## 2020-06-28 DIAGNOSIS — R339 Retention of urine, unspecified: Secondary | ICD-10-CM

## 2020-06-28 DIAGNOSIS — N390 Urinary tract infection, site not specified: Secondary | ICD-10-CM | POA: Diagnosis not present

## 2020-06-28 MED ORDER — CIPROFLOXACIN HCL 500 MG PO TABS
500.0000 mg | ORAL_TABLET | Freq: Once | ORAL | Status: AC
  Administered 2020-06-28: 500 mg via ORAL

## 2020-06-28 MED ORDER — CIPROFLOXACIN HCL 250 MG PO TABS: 250.0000 mg | ORAL_TABLET | Freq: Two times a day (BID) | ORAL | 0 refills | Status: AC

## 2020-06-28 NOTE — Progress Notes (Signed)
06/28/2020 9:26 AM   Randall Schneider 31-Jul-1930 [id number]  CC: Chief Complaint  Patient presents with  . Urinary Retention   HPI: Randall Schneider is a 87 y.o. male with PMH dementia, BPH s/p bladder outlet procedure >10 years ago, and a recent history of recurrent urinary retention who presents today for voiding trial. He is accompanied today by his wife, [name], who contributes to HPI.  He passed a voiding trial with me and Dr. Bernardo Heater on 06/13/2020 and then presented to the ED 2 days later with reports of altered mental status and dysuria.  He was also found to have recurrent urinary retention and Foley was replaced.  UA was notable for nitrites, pyuria, WBC clumps, and budding yeast.  He was treated with cefdinir 300 mg twice daily x9 days after receiving 2 doses of IV ceftriaxone in the hospital.  Urine culture ultimately grew pansensitive Pseudomonas aeruginosa.  He was subsequently seen in clinic by Zara Council 5 days ago following traumatic Foley removal at home.  He was again noted to be in urinary retention with PVR >531mL; Foley was replaced again at that time.  Today he reports ongoing burning discomfort associated with his bladder. His wife states he has continued to be more confused and pulling at his catheter.  PMH: Past Medical History:  Diagnosis Date  . Arthritis   . HOH (hard of hearing)   . Hypercholesteremia   . Hypertension     Surgical History: Past Surgical History:  Procedure Laterality Date  . CATARACT EXTRACTION W/PHACO Right 08/06/2018   Procedure: CATARACT EXTRACTION PHACO AND INTRAOCULAR LENS PLACEMENT ([place] [facility]) RIGHT DIABETES;  Surgeon: Leandrew Koyanagi, MD;  Location: [facility];  Service: Ophthalmology;  Laterality: Right;  . CIRCUMCISION, NON-NEWBORN  05/07/2013  . HERNIA REPAIR    . TONSILLECTOMY     and adenoids  . TRANSURETHRAL RESECTION OF PROSTATE  05/07/2013    Home Medications:  Allergies as of 06/28/2020      Reactions    Sulfa Antibiotics    Unknown reaction "long time ago" Other reaction(s): Unknown Other reaction(s): UNKNOWN Unknown, happened when he was a child      Medication List       Accurate as of June 28, 2020  9:26 AM. If you have any questions, ask your nurse or doctor.        aspirin 81 MG tablet Take 81 mg by mouth daily.   atorvastatin 10 MG tablet Commonly known as: LIPITOR Take 10 mg by mouth daily.   donepezil 10 MG tablet Commonly known as: ARICEPT Take 10 mg by mouth daily.   doxazosin 2 MG tablet Commonly known as: CARDURA Take 2 mg by mouth at bedtime.   FLUoxetine 10 MG tablet Commonly known as: PROZAC Take 10 mg by mouth daily.       Allergies:  Allergies  Allergen Reactions  . Sulfa Antibiotics     Unknown reaction "long time ago" Other reaction(s): Unknown Other reaction(s): UNKNOWN Unknown, happened when he was a child     Family History: No family history on file.  Social History:   reports that he quit smoking about 32 years ago. He has never used smokeless tobacco. He reports current alcohol use. He reports that he does not use drugs.  Physical Exam: BP (!) 91/58   Pulse 83   Temp 97.7 F (36.5 C) (Oral)   Ht 5\' 8"  (1.727 m)   Wt 200 lb (90.7 kg)   BMI 30.41  kg/m   Constitutional:  Drowsy, no acute distress, nontoxic appearing HEENT: [place], AT Cardiovascular: No clubbing, cyanosis, or edema Respiratory: Normal respiratory effort, no increased work of breathing Skin: No rashes, bruises or suspicious lesions Neurologic: Grossly intact, no focal deficits, moving all 4 extremities Psychiatric: Normal mood and affect  Assessment & Plan:   1. Urinary retention Originally scheduled for voiding trial today, however in the setting of untreated Pseudomonas UTI as below, I recommended Foley exchange to reduce biofilm accumulation and plans for voiding trial next week. Patient and wife are in agreement with this plan, see separate procedure note  for details.  2. Pseudomonas urinary tract infection Patient completed a course of culture-inappropriate antibiotics. He is mildly hypotensive today in clinic but afebrile and with normal HR. I obtained a urine specimen from his catheter for repeat culture in case of clinical decompensation, then administered 1 dose of Cipro 500mg  PO prior to Foley exchange today, and am prescribing outpatient Cipro for a total of 7 days of culture appropriate therapy. - CULTURE, URINE COMPREHENSIVE - ciprofloxacin (CIPRO) tablet 500 mg  - ciprofloxacin (CIPRO) 250 MG tablet; Take 1 tablet (250 mg total) by mouth 2 (two) times daily for 13 doses.  Dispense: 13 tablet; Refill: 0   Return in about 1 week (around 07/05/2020) for Voiding trial.  Debroah Loop, PA-C  [facility] [address] [place], [place] [address] [phone]

## 2020-06-28 NOTE — Progress Notes (Signed)
Cath Change/ Replacement  Patient is present today for a catheter change due to urinary retention.  86ml of water was removed from the balloon, a 18FR foley cath was removed without difficulty.  Patient was cleaned and prepped in a sterile fashion with betadine and 2% lidocaine jelly was instilled into the urethra. A 16 FR coude foley cath was replaced into the bladder no complications were noted Urine return was noted 40ml and urine was yellow in color. The balloon was filled with 76ml of sterile water. A night bag was attached for drainage.      Performed by: Debroah Loop, PA-C   Follow up: 1 week for voiding trial

## 2020-07-02 LAB — CULTURE, URINE COMPREHENSIVE

## 2020-07-05 ENCOUNTER — Ambulatory Visit: Payer: Medicare Other | Admitting: Physician Assistant

## 2020-07-05 ENCOUNTER — Other Ambulatory Visit: Payer: Self-pay

## 2020-07-05 ENCOUNTER — Ambulatory Visit: Payer: Self-pay | Admitting: Physician Assistant

## 2020-07-05 DIAGNOSIS — R339 Retention of urine, unspecified: Secondary | ICD-10-CM | POA: Diagnosis not present

## 2020-07-05 LAB — BLADDER SCAN AMB NON-IMAGING

## 2020-07-05 NOTE — Progress Notes (Signed)
07/05/2020 5:23 PM   Randall Schneider September 23, 1930 [id number]  CC: Chief Complaint  Patient presents with  . Urinary Retention   HPI: Randall Schneider is a 87 y.o. male with PMH dementia, BPH s/p bladder outlet procedure >10 years ago, and recent history of recurrent urinary retention associated with Pseudomonas UTI who presents today for voiding trial.  He is accompanied today by his wife, [name], who contributes to HPI.    Since his last clinic visit with me 7 days ago, he completed a course of culture appropriate antibiotics.  Repeat culture again showed pansensitive Pseudomonas aeruginosa.  Foley catheter removed in clinic in the morning, see separate procedure note for details.  Patient returned to clinic in the afternoon for repeat PVR.  He reported drinking approximately 24 ounces of fluid.  He had been able to void.  PVR 144 mL.  PMH: Past Medical History:  Diagnosis Date  . Arthritis   . HOH (hard of hearing)   . Hypercholesteremia   . Hypertension     Surgical History: Past Surgical History:  Procedure Laterality Date  . CATARACT EXTRACTION W/PHACO Right 08/06/2018   Procedure: CATARACT EXTRACTION PHACO AND INTRAOCULAR LENS PLACEMENT ([place] [facility]) RIGHT DIABETES;  Surgeon: Leandrew Koyanagi, MD;  Location: [facility];  Service: Ophthalmology;  Laterality: Right;  . CIRCUMCISION, NON-NEWBORN  05/07/2013  . HERNIA REPAIR    . TONSILLECTOMY     and adenoids  . TRANSURETHRAL RESECTION OF PROSTATE  05/07/2013    Home Medications:  Allergies as of 07/05/2020      Reactions   Sulfa Antibiotics    Unknown reaction "long time ago" Other reaction(s): Unknown Other reaction(s): UNKNOWN Unknown, happened when he was a child      Medication List       Accurate as of July 05, 2020  5:23 PM. If you have any questions, ask your nurse or doctor.        aspirin 81 MG tablet Take 81 mg by mouth daily.   atorvastatin 10 MG tablet Commonly known as: LIPITOR Take 10  mg by mouth daily.   ciprofloxacin 250 MG tablet Commonly known as: CIPRO Take 1 tablet (250 mg total) by mouth 2 (two) times daily for 13 doses.   donepezil 10 MG tablet Commonly known as: ARICEPT Take 10 mg by mouth daily.   doxazosin 2 MG tablet Commonly known as: CARDURA Take 2 mg by mouth at bedtime.   FLUoxetine 10 MG tablet Commonly known as: PROZAC Take 10 mg by mouth daily.       Allergies:  Allergies  Allergen Reactions  . Sulfa Antibiotics     Unknown reaction "long time ago" Other reaction(s): Unknown Other reaction(s): UNKNOWN Unknown, happened when he was a child     Family History: No family history on file.  Social History:   reports that he quit smoking about 32 years ago. He has never used smokeless tobacco. He reports current alcohol use. He reports that he does not use drugs.  Physical Exam: There were no vitals taken for this visit.  Constitutional:  Alert and oriented, no acute distress, nontoxic appearing HEENT: [place], AT Cardiovascular: No clubbing, cyanosis, or edema Respiratory: Normal respiratory effort, no increased work of breathing Skin: No rashes, bruises or suspicious lesions Neurologic: Grossly intact, no focal deficits, moving all 4 extremities Psychiatric: Normal mood and affect  Laboratory Data: Results for orders placed or performed in visit on 07/05/20  Bladder Scan (Post Void Residual) in office  Result Value Ref Range   Scan Result 132mL    Assessment & Plan:   1. Urinary retention Voiding trial passed.  He is already scheduled for cystoscopy and DRE with Dr. Bernardo Heater next month.  May consider initiation of finasteride if he is found to have tissue regrowth on cystoscopy to reduce his risk of recurrent retention. - Bladder Scan (Post Void Residual) in office  Return if symptoms worsen or fail to improve.  Debroah Loop, PA-C  [facility] [address], [place], [place]  [address] [phone]

## 2020-07-05 NOTE — Progress Notes (Signed)
Catheter Removal  Patient is present today for a catheter removal.  9ml of water was drained from the balloon. A 16FR coude foley cath was removed from the bladder no complications were noted . Patient tolerated well.  Performed by: Isley Weisheit, PA-C   Follow up/ Additional notes: Push fluids and RTC this afternoon for PVR. 

## 2020-07-11 ENCOUNTER — Emergency Department: Payer: Medicare Other

## 2020-07-11 ENCOUNTER — Inpatient Hospital Stay
Admission: EM | Admit: 2020-07-11 | Discharge: 2020-07-14 | DRG: 177 | Disposition: A | Discharge status: Home Health | Payer: Medicare Other | Attending: Internal Medicine | Admitting: Internal Medicine

## 2020-07-11 ENCOUNTER — Other Ambulatory Visit: Payer: Self-pay

## 2020-07-11 ENCOUNTER — Encounter: Payer: Self-pay | Admitting: Emergency Medicine

## 2020-07-11 DIAGNOSIS — Z7982 Long term (current) use of aspirin: Secondary | ICD-10-CM

## 2020-07-11 DIAGNOSIS — E785 Hyperlipidemia, unspecified: Secondary | ICD-10-CM | POA: Diagnosis present

## 2020-07-11 DIAGNOSIS — J9601 Acute respiratory failure with hypoxia: Secondary | ICD-10-CM | POA: Diagnosis present

## 2020-07-11 DIAGNOSIS — M19012 Primary osteoarthritis, left shoulder: Secondary | ICD-10-CM | POA: Diagnosis present

## 2020-07-11 DIAGNOSIS — J9600 Acute respiratory failure, unspecified whether with hypoxia or hypercapnia: Secondary | ICD-10-CM

## 2020-07-11 DIAGNOSIS — F0390 Unspecified dementia without behavioral disturbance: Secondary | ICD-10-CM | POA: Diagnosis present

## 2020-07-11 DIAGNOSIS — H9190 Unspecified hearing loss, unspecified ear: Secondary | ICD-10-CM | POA: Diagnosis present

## 2020-07-11 DIAGNOSIS — R531 Weakness: Secondary | ICD-10-CM

## 2020-07-11 DIAGNOSIS — E7800 Pure hypercholesterolemia, unspecified: Secondary | ICD-10-CM | POA: Diagnosis present

## 2020-07-11 DIAGNOSIS — I10 Essential (primary) hypertension: Secondary | ICD-10-CM | POA: Diagnosis present

## 2020-07-11 DIAGNOSIS — Z87891 Personal history of nicotine dependence: Secondary | ICD-10-CM

## 2020-07-11 DIAGNOSIS — D6959 Other secondary thrombocytopenia: Secondary | ICD-10-CM | POA: Diagnosis present

## 2020-07-11 DIAGNOSIS — M1990 Unspecified osteoarthritis, unspecified site: Secondary | ICD-10-CM | POA: Diagnosis present

## 2020-07-11 DIAGNOSIS — Z882 Allergy status to sulfonamides status: Secondary | ICD-10-CM

## 2020-07-11 DIAGNOSIS — M19011 Primary osteoarthritis, right shoulder: Secondary | ICD-10-CM | POA: Diagnosis present

## 2020-07-11 DIAGNOSIS — Z79899 Other long term (current) drug therapy: Secondary | ICD-10-CM | POA: Diagnosis not present

## 2020-07-11 DIAGNOSIS — G309 Alzheimer's disease, unspecified: Secondary | ICD-10-CM

## 2020-07-11 DIAGNOSIS — U071 COVID-19: Secondary | ICD-10-CM | POA: Diagnosis present

## 2020-07-11 DIAGNOSIS — J1282 Pneumonia due to coronavirus disease 2019: Secondary | ICD-10-CM | POA: Diagnosis present

## 2020-07-11 DIAGNOSIS — F0280 Dementia in other diseases classified elsewhere without behavioral disturbance: Secondary | ICD-10-CM | POA: Diagnosis not present

## 2020-07-11 LAB — CBC
HCT: 37.2 % — ABNORMAL LOW (ref 39.0–52.0)
Hemoglobin: 12.3 g/dL — ABNORMAL LOW (ref 13.0–17.0)
MCH: 29.6 pg (ref 26.0–34.0)
MCHC: 33.1 g/dL (ref 30.0–36.0)
MCV: 89.6 fL (ref 80.0–100.0)
Platelets: 155 10*3/uL (ref 150–400)
RBC: 4.15 MIL/uL — ABNORMAL LOW (ref 4.22–5.81)
RDW: 13.3 % (ref 11.5–15.5)
WBC: 4.9 10*3/uL (ref 4.0–10.5)
nRBC: 0 % (ref 0.0–0.2)

## 2020-07-11 LAB — PROCALCITONIN: Procalcitonin: <0.1 ng/mL

## 2020-07-11 LAB — BASIC METABOLIC PANEL
Anion gap: 8 (ref 5–15)
BUN: 20 mg/dL (ref 8–23)
CO2: 23 mmol/L (ref 22–32)
Calcium: 8.7 mg/dL — ABNORMAL LOW (ref 8.9–10.3)
Chloride: 105 mmol/L (ref 98–111)
Creatinine, Ser: 0.8 mg/dL (ref 0.61–1.24)
GFR, Estimated: >60 mL/min (ref >60)
Glucose, Bld: 106 mg/dL — ABNORMAL HIGH (ref 70–99)
Potassium: 3.5 mmol/L (ref 3.5–5.1)
Sodium: 136 mmol/L (ref 135–145)

## 2020-07-11 LAB — RESP PANEL BY RT-PCR (FLU A&B, COVID) ARPGX2
Influenza A by PCR: NEGATIVE
Influenza B by PCR: NEGATIVE
SARS Coronavirus 2 by RT PCR: POSITIVE — AB

## 2020-07-11 MED ORDER — SODIUM CHLORIDE 0.9 % IV SOLN: INTRAVENOUS | Status: AC

## 2020-07-11 MED ORDER — FLUOXETINE HCL 10 MG PO CAPS
10.0000 mg | ORAL_CAPSULE | Freq: Every day | ORAL | Status: DC
  Administered 2020-07-12 – 2020-07-14 (×3): 10 mg via ORAL
  Filled 2020-07-11 (×4): qty 1

## 2020-07-11 MED ORDER — DOXAZOSIN MESYLATE 2 MG PO TABS
2.0000 mg | ORAL_TABLET | Freq: Every day | ORAL | Status: DC
  Administered 2020-07-11 – 2020-07-13 (×3): 2 mg via ORAL
  Filled 2020-07-11 (×4): qty 1

## 2020-07-11 MED ORDER — ACETAMINOPHEN 325 MG PO TABS: 650.0000 mg | ORAL_TABLET | Freq: Four times a day (QID) | ORAL | Status: DC | PRN

## 2020-07-11 MED ORDER — ONDANSETRON HCL 4 MG/2ML IJ SOLN: 4.0000 mg | Freq: Four times a day (QID) | INTRAMUSCULAR | Status: DC | PRN

## 2020-07-11 MED ORDER — SODIUM CHLORIDE 0.9 % IV SOLN: 100.0000 mg | Freq: Every day | INTRAVENOUS | Status: DC

## 2020-07-11 MED ORDER — GUAIFENESIN-DM 100-10 MG/5ML PO SYRP
10.0000 mL | ORAL_SOLUTION | ORAL | Status: DC | PRN
  Administered 2020-07-13: 10 mL via ORAL
  Filled 2020-07-11: qty 10

## 2020-07-11 MED ORDER — DONEPEZIL HCL 5 MG PO TABS
10.0000 mg | ORAL_TABLET | Freq: Every day | ORAL | Status: DC
  Administered 2020-07-12 – 2020-07-14 (×3): 10 mg via ORAL
  Filled 2020-07-11 (×3): qty 2

## 2020-07-11 MED ORDER — DEXAMETHASONE SODIUM PHOSPHATE 10 MG/ML IJ SOLN
6.0000 mg | Freq: Once | INTRAMUSCULAR | Status: AC
  Administered 2020-07-11: 6 mg via INTRAVENOUS
  Filled 2020-07-11: qty 1

## 2020-07-11 MED ORDER — SODIUM CHLORIDE 0.9 % IV SOLN
200.0000 mg | Freq: Once | INTRAVENOUS | Status: AC
  Administered 2020-07-11: 200 mg via INTRAVENOUS
  Filled 2020-07-11: qty 40

## 2020-07-11 MED ORDER — ONDANSETRON HCL 4 MG PO TABS: 4.0000 mg | ORAL_TABLET | Freq: Four times a day (QID) | ORAL | Status: DC | PRN

## 2020-07-11 MED ORDER — ACETAMINOPHEN 500 MG PO TABS
1000.0000 mg | ORAL_TABLET | Freq: Once | ORAL | Status: AC
  Administered 2020-07-11: 1000 mg via ORAL
  Filled 2020-07-11: qty 2

## 2020-07-11 MED ORDER — ALBUTEROL SULFATE HFA 108 (90 BASE) MCG/ACT IN AERS
2.0000 | INHALATION_SPRAY | Freq: Four times a day (QID) | RESPIRATORY_TRACT | Status: DC
  Administered 2020-07-11 – 2020-07-14 (×11): 2 via RESPIRATORY_TRACT
  Filled 2020-07-11: qty 6.7

## 2020-07-11 MED ORDER — ASCORBIC ACID 500 MG PO TABS
500.0000 mg | ORAL_TABLET | Freq: Every day | ORAL | Status: DC
  Administered 2020-07-12 – 2020-07-14 (×3): 500 mg via ORAL
  Filled 2020-07-11 (×3): qty 1

## 2020-07-11 MED ORDER — ATORVASTATIN CALCIUM 10 MG PO TABS
10.0000 mg | ORAL_TABLET | Freq: Every day | ORAL | Status: DC
  Administered 2020-07-12 – 2020-07-14 (×3): 10 mg via ORAL
  Filled 2020-07-11 (×3): qty 1

## 2020-07-11 MED ORDER — ENOXAPARIN SODIUM 40 MG/0.4ML IJ SOSY
40.0000 mg | PREFILLED_SYRINGE | INTRAMUSCULAR | Status: DC
  Administered 2020-07-11 – 2020-07-13 (×3): 40 mg via SUBCUTANEOUS
  Filled 2020-07-11 (×3): qty 0.4

## 2020-07-11 MED ORDER — ASPIRIN EC 81 MG PO TBEC
81.0000 mg | DELAYED_RELEASE_TABLET | Freq: Every day | ORAL | Status: DC
  Administered 2020-07-12 – 2020-07-14 (×3): 81 mg via ORAL
  Filled 2020-07-11 (×3): qty 1

## 2020-07-11 MED ORDER — DEXAMETHASONE SODIUM PHOSPHATE 10 MG/ML IJ SOLN
6.0000 mg | INTRAMUSCULAR | Status: DC
  Administered 2020-07-12 – 2020-07-14 (×3): 6 mg via INTRAVENOUS
  Filled 2020-07-11 (×3): qty 1

## 2020-07-11 MED ORDER — SODIUM CHLORIDE 0.9 % IV SOLN
100.0000 mg | Freq: Every day | INTRAVENOUS | Status: DC
  Administered 2020-07-12 – 2020-07-14 (×3): 100 mg via INTRAVENOUS
  Filled 2020-07-11 (×3): qty 100

## 2020-07-11 MED ORDER — ZINC SULFATE 220 (50 ZN) MG PO CAPS
220.0000 mg | ORAL_CAPSULE | Freq: Every day | ORAL | Status: DC
  Administered 2020-07-12 – 2020-07-14 (×3): 220 mg via ORAL
  Filled 2020-07-11 (×3): qty 1

## 2020-07-11 MED ORDER — DEXAMETHASONE SODIUM PHOSPHATE 10 MG/ML IJ SOLN: 6.0000 mg | INTRAMUSCULAR | Status: DC

## 2020-07-11 MED ORDER — SODIUM CHLORIDE 0.9 % IV SOLN: 200.0000 mg | Freq: Once | INTRAVENOUS | Status: DC

## 2020-07-11 NOTE — ED Triage Notes (Signed)
Patient to ER from home via [facility] for c/o generalized weakness and +Covid test yesterday. Patient was 92% on RA, 128/80. Has h/o alzheimer's and arrives with incontinence of urine.

## 2020-07-11 NOTE — Progress Notes (Signed)
   07/11/20 1445  Assess: if the MEWS score is Yellow or Red  Were vital signs taken at a resting state? Yes  Focused Assessment No change from prior assessment  Early Detection of Sepsis Score *See Row Information* Low  MEWS guidelines implemented *See Row Information* No, altered LOC is baseline  Treat  Pain Scale Faces  Breathing 0  Negative Vocalization 0  Facial Expression 0  Body Language 0  Consolability 0  PAINAD Score 0  Take Vital Signs  Increase Vital Sign Frequency  Yellow: Q 2hr X 2 then Q 4hr X 2, if remains yellow, continue Q 4hrs

## 2020-07-11 NOTE — Consult Note (Signed)
Remdesivir - Pharmacy Brief Note Presenting w/ c/o generalized weakness and +Covid test yesterday.   O:  ALT: 17 (BL 17 05/25/20) CXR: CT chest negative for cardiopulmonary dz SpO2: 91-97% on 2L [place]   A/P:  Remdesivir 200 mg IVPB once followed by 100 mg IVPB daily x 4 days.   Lorna Dibble, [facility] 07/11/2020 10:39 AM

## 2020-07-11 NOTE — Progress Notes (Signed)
   07/11/20 1753  Assess: MEWS Score  Temp 98.4 F (36.9 C)  BP (!) 162/82  Pulse Rate 65  Resp 17  SpO2 97 %  Assess: MEWS Score  MEWS Temp 0  MEWS Systolic 0  MEWS Pulse 0  MEWS RR 0  MEWS LOC 2  MEWS Score 2  MEWS Score Color Yellow  Notify: Charge Nurse/RN  Name of Charge Nurse/RN Notified Danae Chen  Date Charge Nurse/RN Notified 07/11/20  Time Charge Nurse/RN Notified 1800

## 2020-07-11 NOTE — ED Provider Notes (Signed)
[facility] Emergency Department Provider Note ____________________________________________   Event Date/Time   First MD Initiated Contact with Patient 07/11/20 [id number]     (approximate)  I have reviewed the triage vital signs and the nursing notes.  HISTORY  Chief Complaint Weakness   HPI Randall Schneider is a 87 y.o. malewho presents to the ED for evaluation of generalized weakness.  Chart review indicates history of dementia, HTN, HLD.  Recent admission from 4/6-4/8 due to acute cystitis and associated metabolic encephalopathy.  Discharged home with home health.   Patient comes from home via EMS for evaluation of weakness, shortness of breath and confusion.  Reportedly tested positive on a home antigen test yesterday for COVID-19.  Patient reports that he does not think he has been vaccinated.  He is slightly disoriented, somewhat limiting history acquisition, and reports generalized weakness primarily.  Reports his wife is at home with the same.   Past Medical History:  Diagnosis Date  . Arthritis   . HOH (hard of hearing)   . Hypercholesteremia   . Hypertension     Patient Active Problem List   Diagnosis Date Noted  . Weakness 06/15/2020  . Essential hypertension 06/15/2020  . Dementia ([place]) 06/15/2020  . Acute lower UTI 06/15/2020  . Hyperlipidemia 06/15/2020    Past Surgical History:  Procedure Laterality Date  . CATARACT EXTRACTION W/PHACO Right 08/06/2018   Procedure: CATARACT EXTRACTION PHACO AND INTRAOCULAR LENS PLACEMENT ([place]) RIGHT DIABETES;  Surgeon: Leandrew Koyanagi, MD;  Location: [place];  Service: Ophthalmology;  Laterality: Right;  . CIRCUMCISION, NON-NEWBORN  05/07/2013  . HERNIA REPAIR    . TONSILLECTOMY     and adenoids  . TRANSURETHRAL RESECTION OF PROSTATE  05/07/2013    Prior to Admission medications   Medication Sig Start Date End Date Taking? Authorizing Provider  aspirin 81 MG tablet Take 81 mg by  mouth daily.    [provider]  atorvastatin (LIPITOR) 10 MG tablet Take 10 mg by mouth daily.    [provider]  donepezil (ARICEPT) 10 MG tablet Take 10 mg by mouth daily.    [provider]  doxazosin (CARDURA) 2 MG tablet Take 2 mg by mouth at bedtime. 03/28/20   [provider]  FLUoxetine (PROZAC) 10 MG tablet Take 10 mg by mouth daily.    [provider]    Allergies Sulfa antibiotics  No family history on file.  Social History Social History   Tobacco Use  . Smoking status: Former Smoker    Quit date: 1990    Years since quitting: 32.3  . Smokeless tobacco: Never Used  Vaping Use  . Vaping Use: Never used  Substance Use Topics  . Alcohol use: Yes    Comment: rare  . Drug use: Never    Review of Systems  Unable to accurately assess due to confusion and disorientation ____________________________________________   PHYSICAL EXAM:  VITAL SIGNS: Vitals:   07/11/20 1003 07/11/20 1030  BP:  (!) 149/81  Pulse:  60  Resp:  20  Temp: 100.3 F (37.9 C)   SpO2:  91%      Constitutional: Alert and oriented to self and location, but disoriented to year and situation.  Slumped over, hard of hearing.  No distress.  Mild tachypnea. Eyes: Conjunctivae are normal. PERRL. EOMI. Head: Atraumatic. Nose: No congestion/rhinnorhea. Mouth/Throat: Mucous membranes are moist.  Oropharynx non-erythematous. Neck: No stridor. No cervical spine tenderness to palpation. Cardiovascular: Normal rate, regular rhythm.  Grossly normal heart sounds.  Good peripheral circulation. Respiratory: Mild tachypnea to the mid 20s.  No wheezes.  No retractions or distress. Gastrointestinal: Soft , nondistended, nontender to palpation. No CVA tenderness. Musculoskeletal: No lower extremity tenderness nor edema.  No joint effusions. No signs of acute trauma. Neurologic:  Normal speech and language. No gross focal neurologic deficits are appreciated. Skin:   Skin is warm, dry and intact. No rash noted. Psychiatric: Mood and affect are normal. Speech and behavior are normal.  ____________________________________________   LABS (all labs ordered are listed, but only abnormal results are displayed)  Labs Reviewed  RESP PANEL BY RT-PCR (FLU A&B, COVID) ARPGX2 - Abnormal; Notable for the following components:      Result Value   SARS Coronavirus 2 by RT PCR POSITIVE (*)    All other components within normal limits  BASIC METABOLIC PANEL - Abnormal; Notable for the following components:   Glucose, Bld 106 (*)    Calcium 8.7 (*)    All other components within normal limits  CBC - Abnormal; Notable for the following components:   RBC 4.15 (*)    Hemoglobin 12.3 (*)    HCT 37.2 (*)    All other components within normal limits  CULTURE, BLOOD (SINGLE)  URINALYSIS, COMPLETE (UACMP) WITH MICROSCOPIC   ____________________________________________  12 Lead EKG  Sinus rhythm, rate of 63 bpm.  Normal axis and intervals.  No evidence of acute ischemia. ____________________________________________  RADIOLOGY  ED MD interpretation: 1 view CXR with streaky bilateral multifocal infiltrates without discrete lobar filtration  Official radiology report(s): DG Chest Portable 1 View  Result Date: 07/11/2020 CLINICAL DATA:  Reported COVID-19 positive.  Weakness. EXAM: PORTABLE CHEST 1 VIEW COMPARISON:  June 15, 2020 FINDINGS: There is ill-defined airspace opacity in the left lower lobe. The lungs elsewhere are clear. Heart size and pulmonary vascular normal. No adenopathy. There is degenerative change in each shoulder. 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 IMPRESSION: Ill-defined airspace opacity left lower lobe concerning for pneumonia. Atypical organism pneumonia may present in this manner. No consolidation. Lungs otherwise clear. Heart size normal.  Arthropathy in each shoulder. Electronically Signed   By: Lowella Grip III  M.D.   On: 07/11/2020 10:45    ____________________________________________   PROCEDURES and INTERVENTIONS  Procedure(s) performed (including Critical Care):  .1-3 Lead EKG Interpretation Performed by: Vladimir Crofts, MD Authorized by: Vladimir Crofts, MD     Interpretation: normal     ECG rate:  64   ECG rate assessment: normal     Rhythm: sinus rhythm     Ectopy: none     Conduction: normal   .Critical Care Performed by: Vladimir Crofts, MD Authorized by: Vladimir Crofts, MD   Critical care provider statement:    Critical care time (minutes):  35   Critical care was necessary to treat or prevent imminent or life-threatening deterioration of the following conditions:  Respiratory failure   Critical care was time spent personally by me on the following activities:  Discussions with consultants, evaluation of patient's response to treatment, examination of patient, ordering and performing treatments and interventions, ordering and review of laboratory studies, ordering and review of radiographic studies, pulse oximetry, re-evaluation of patient's condition, obtaining history from patient or surrogate and review of old charts    Medications  dexamethasone (DECADRON) injection 6 mg (has no administration in time range)  remdesivir 200 mg in sodium chloride 0.9% 250 mL IVPB (has no administration in time range)  Followed by  remdesivir 100 mg in sodium chloride 0.9 % 100 mL IVPB (has no administration in time range)  acetaminophen (TYLENOL) tablet 1,000 mg (1,000 mg Oral Given 07/11/20 1010)    ____________________________________________   MDM / ED COURSE   87 year old male presents to the ED with acute hypoxic respiratory failure attributed to COVID-19 requiring medical admission.  Low-grade fevers and sats of 90% on room air.  Otherwise hemodynamically stable.  Exam with nonfocal confusion and disorientation.  No evidence of trauma, distress.  No wheezing.  Blood work is unremarkable.   No leukocytosis or lobar filtration to suggest bacterial pneumonia.  COVID-19 swab returns positive, due to his hypoxia we will provide Decadron, remdesivir and admit to hospitalist service for further work-up and management.     ____________________________________________   FINAL CLINICAL IMPRESSION(S) / ED DIAGNOSES  Final diagnoses:  Acute hypoxemic respiratory failure due to COVID-19 [facility])     ED Discharge Orders    None       Juanjose Mojica   Note:  This document was prepared using Dragon voice recognition software and may include unintentional dictation errors.   Vladimir Crofts, MD 07/11/20 1059

## 2020-07-11 NOTE — H&P (Signed)
History and Physical    DANFORD TAT WCB:[id number] DOB: Feb 19, 1931 DOA: 07/11/2020  PCP: Sofie Hartigan, MD   Patient coming from: Home  I have personally briefly reviewed patient's old medical records in [place]  Chief Complaint: Weakness/poor oral intake/fever Most of the history was obtained from patient's son and ER records  HPI: Randall Schneider is a 87 y.o. male with medical history  significant for hypertension, dementia, hyperlipidemia who was brought into the ER by EMS for evaluation of generalized weakness and poor oral intake for about 2 days.  Patient was said to have had a positive home COVID test 1 day prior to his admission and his wife also has a viral illness related to COVID-19 infection. Per patient's son he received 2 doses of the COVID-19 vaccine but did not get the booster dose.  He states that his oral intake has been poor and the patient has been very weak and unable to ambulate.  On the morning of his admission his son states he had a fever (undocumented).  Patient also has shortness of breath as well as a nonproductivecough as well as. Patient was recently discharged from the hospital on 04/08 following hospitalization for acute cystitis. I am unable to do a review of systems on this patient due to his underlying dementia. Labs show creatinine 136, potassium 3.5, chloride 105, bicarb 23, glucose 106, BUN 20, creatinine 0.80, calcium 8.7, white count 4.9, hemoglobin 12.3, hematocrit 37.2, MCV 89.6, RDW 13.3, platelet count 155 Patient's SARS coronavirus 2 point-of-care test is positive Chest x-ray reviewed by me shows ill-defined airspace opacity left lower lobe concerning for pneumonia. Atypical organism pneumonia may present in this manner. No consolidation. Lungs otherwise clear. Heart size normal.  Arthropathy in each shoulder. Twelve-lead EKG reviewed by me shows sinus rhythm with PACs.     ED Course: Patient is an 87 year old Caucasian male who  presents to the ER via EMS for evaluation of generalized weakness, poor oral intake nonproductive cough as well as fever.  Patient's SARS coronavirus 2 point-of-care test is positive.  He received 2 doses of the COVID-19 vaccine but did not get the booster dose.  Chest x-ray shows a right lower lobe infiltrate.  Patient had room air pulse oximetry of 90 percent when he arrived to the ER and was placed on 2 L of oxygen.  He received IV remdesivir and Decadron in the ER and will be admitted to the hospital for further evaluation..   Review of Systems: As per HPI otherwise all other systems reviewed and negative.    Past Medical History:  Diagnosis Date  . Arthritis   . HOH (hard of hearing)   . Hypercholesteremia   . Hypertension     Past Surgical History:  Procedure Laterality Date  . CATARACT EXTRACTION W/PHACO Right 08/06/2018   Procedure: CATARACT EXTRACTION PHACO AND INTRAOCULAR LENS PLACEMENT ([place]) RIGHT DIABETES;  Surgeon: Leandrew Koyanagi, MD;  Location: [place];  Service: Ophthalmology;  Laterality: Right;  . CIRCUMCISION, NON-NEWBORN  05/07/2013  . HERNIA REPAIR    . TONSILLECTOMY     and adenoids  . TRANSURETHRAL RESECTION OF PROSTATE  05/07/2013     reports that he quit smoking about 32 years ago. He has never used smokeless tobacco. He reports current alcohol use. He reports that he does not use drugs.  Allergies  Allergen Reactions  . Sulfa Antibiotics     Unknown reaction "long time ago" Other reaction(s): Unknown Other reaction(s): UNKNOWN  Unknown, happened when he was a child     Family History  Family history unknown: Yes      Prior to Admission medications   Medication Sig Start Date End Date Taking? Authorizing Provider  aspirin 81 MG tablet Take 81 mg by mouth daily.   Yes [provider]  atorvastatin (LIPITOR) 10 MG tablet Take 10 mg by mouth daily.   Yes [provider]  donepezil (ARICEPT) 10 MG tablet Take 10 mg by  mouth daily.   Yes [provider]  doxazosin (CARDURA) 2 MG tablet Take 2 mg by mouth at bedtime. 03/28/20  Yes [provider]  FLUoxetine (PROZAC) 10 MG tablet Take 10 mg by mouth daily.   Yes [provider]    Physical Exam: Vitals:   07/11/20 0923 07/11/20 0926 07/11/20 1003 07/11/20 1030  BP:  120/64  (!) 149/81  Pulse:  66  60  Resp:  (!) 25  20  Temp:  97.6 F (36.4 C) 100.3 F (37.9 C)   TempSrc:  Oral Rectal   SpO2:  97%  91%  Weight: 84.4 kg     Height: 5\' 9"  (1.753 m)        Vitals:   07/11/20 0923 07/11/20 0926 07/11/20 1003 07/11/20 1030  BP:  120/64  (!) 149/81  Pulse:  66  60  Resp:  (!) 25  20  Temp:  97.6 F (36.4 C) 100.3 F (37.9 C)   TempSrc:  Oral Rectal   SpO2:  97%  91%  Weight: 84.4 kg     Height: 5\' 9"  (1.753 m)         Constitutional: Alert and oriented x 2 (person and place). Not in any apparent distress HEENT:      Head: Normocephalic and atraumatic.         Eyes: PERLA, EOMI, Conjunctivae are normal. Sclera is non-icteric.       Mouth/Throat: Mucous membranes are moist.       Neck: Supple with no signs of meningismus. Cardiovascular: Regular rate and rhythm. No murmurs, gallops, or rubs. 2+ symmetrical distal pulses are present . No JVD. No LE edema Respiratory: Respiratory effort normal . bilateral air entry .  No wheezes or rhonchi.  Gastrointestinal: Soft, non tender, and non distended with positive bowel sounds.  Genitourinary: No CVA tenderness. Musculoskeletal: Nontender with normal range of motion in all extremities. No cyanosis, or erythema of extremities. Neurologic:  Face is symmetric. Moving all extremities. No gross focal neurologic deficits . Skin: Skin is warm, dry.  No rash or ulcers Psychiatric: Mood and affect are normal    Labs on Admission: I have personally reviewed following labs and imaging studies  CBC: Recent Labs  Lab 07/11/20 0935  WBC 4.9  HGB 12.3*  HCT 37.2*  MCV 89.6   PLT 99991111   Basic Metabolic Panel: Recent Labs  Lab 07/11/20 0935  NA 136  K 3.5  CL 105  CO2 23  GLUCOSE 106*  BUN 20  CREATININE 0.80  CALCIUM 8.7*   GFR: Estimated Creatinine Clearance: 65.1 mL/min (by C-G formula based on SCr of 0.8 mg/dL). Liver Function Tests: No results for input(s): AST, ALT, ALKPHOS, BILITOT, PROT, ALBUMIN in the last 168 hours. No results for input(s): LIPASE, AMYLASE in the last 168 hours. No results for input(s): AMMONIA in the last 168 hours. Coagulation Profile: No results for input(s): INR, PROTIME in the last 168 hours. Cardiac Enzymes: No results for input(s): CKTOTAL, CKMB,  CKMBINDEX, TROPONINI in the last 168 hours. BNP (last 3 results) No results for input(s): PROBNP in the last 8760 hours. HbA1C: No results for input(s): HGBA1C in the last 72 hours. CBG: No results for input(s): GLUCAP in the last 168 hours. Lipid Profile: No results for input(s): CHOL, HDL, LDLCALC, TRIG, CHOLHDL, LDLDIRECT in the last 72 hours. Thyroid Function Tests: No results for input(s): TSH, T4TOTAL, FREET4, T3FREE, THYROIDAB in the last 72 hours. Anemia Panel: No results for input(s): VITAMINB12, FOLATE, FERRITIN, TIBC, IRON, RETICCTPCT in the last 72 hours. Urine analysis:    Component Value Date/Time   COLORURINE YELLOW (A) 06/15/2020 2139   APPEARANCEUR TURBID (A) 06/15/2020 2139   LABSPEC 1.017 06/15/2020 2139   PHURINE 6.0 06/15/2020 2139   GLUCOSEU NEGATIVE 06/15/2020 2139   HGBUR MODERATE (A) 06/15/2020 2139   BILIRUBINUR NEGATIVE 06/15/2020 2139   KETONESUR 5 (A) 06/15/2020 2139   PROTEINUR 100 (A) 06/15/2020 2139   NITRITE POSITIVE (A) 06/15/2020 2139   LEUKOCYTESUR MODERATE (A) 06/15/2020 2139    Radiological Exams on Admission: DG Chest Portable 1 View  Result Date: 07/11/2020 CLINICAL DATA:  Reported COVID-19 positive.  Weakness. EXAM: PORTABLE CHEST 1 VIEW COMPARISON:  June 15, 2020 FINDINGS: There is ill-defined airspace opacity in the  left lower lobe. The lungs elsewhere are clear. Heart size and pulmonary vascular normal. No adenopathy. There is degenerative change in each shoulder. 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 IMPRESSION: Ill-defined airspace opacity left lower lobe concerning for pneumonia. Atypical organism pneumonia may present in this manner. No consolidation. Lungs otherwise clear. Heart size normal.  Arthropathy in each shoulder. Electronically Signed   By: Lowella Grip III M.D.   On: 07/11/2020 10:45     Assessment/Plan Principal Problem:   Pneumonia due to COVID-19 virus Active Problems:   Weakness   Essential hypertension   Dementia (HCC)   Acute respiratory failure due to COVID-19 [facility])    Pneumonia due to COVID-19 virus with acute respiratory failure Patient presents for evaluation of weakness and poor oral intake and had a positive home COVID-19 test on 07/10/20 Chest x-ray shows left lower lobe infiltrate and patient had room air pulse oximetry of 90% upon arrival and is currently on 2 L of oxygen via nasal cannula Patient received 2 doses of the COVID-19 vaccine but did not receive the booster dose Place patient on remdesivir per protocol Start patient on Decadron 6 mg IV daily Supportive care with antitussives and bronchodilator therapy Monitor inflammatory markers   Generalized weakness Most likely related to acute COVID-19 viral infection We will request PT evaluation once patient's acute illness has resolved   Dementia Continue Aricept and fluoxetine    DVT prophylaxis: Lovenox Code Status: full code Family Communication: Greater than 50% of time was discussing patient's condition and plan of care Randall Schneider at the bedside.  All questions and concerns have been addressed.  He verbalizes understanding and agrees with the plan.  CODE STATUS was discussed and patient is a full code. Disposition Plan: Back to previous home environment Consults  called: none Status: At the time of admission, it appears that the appropriate admission status for this patient is inpatient. This is judged to be reasonable and necessary in order to provide the required intensity of service to ensure the patient's safety given the presenting symptoms, physical exam findings and initial radiographic and laboratory data in the context of their comorbid conditions. Patient requires inpatient status due to high intensity of  service, high risk for further deterioration and high frequency of surveillance required.    Collier Bullock MD [facility]     07/11/2020, 12:07 PM

## 2020-07-11 NOTE — ED Notes (Signed)
Informed RN bed assigned 1228

## 2020-07-11 NOTE — Plan of Care (Signed)

## 2020-07-12 DIAGNOSIS — I10 Essential (primary) hypertension: Secondary | ICD-10-CM

## 2020-07-12 LAB — CBC WITH DIFFERENTIAL/PLATELET
Abs Immature Granulocytes: 0.02 10*3/uL (ref 0.00–0.07)
Basophils Absolute: 0 10*3/uL (ref 0.0–0.1)
Basophils Relative: 0 %
Eosinophils Absolute: 0 10*3/uL (ref 0.0–0.5)
Eosinophils Relative: 0 %
HCT: 36.1 % — ABNORMAL LOW (ref 39.0–52.0)
Hemoglobin: 12 g/dL — ABNORMAL LOW (ref 13.0–17.0)
Immature Granulocytes: 1 %
Lymphocytes Relative: 17 %
Lymphs Abs: 0.7 10*3/uL (ref 0.7–4.0)
MCH: 30.2 pg (ref 26.0–34.0)
MCHC: 33.2 g/dL (ref 30.0–36.0)
MCV: 90.9 fL (ref 80.0–100.0)
Monocytes Absolute: 0.5 10*3/uL (ref 0.1–1.0)
Monocytes Relative: 11 %
Neutro Abs: 3 10*3/uL (ref 1.7–7.7)
Neutrophils Relative %: 71 %
Platelets: 141 10*3/uL — ABNORMAL LOW (ref 150–400)
RBC: 3.97 MIL/uL — ABNORMAL LOW (ref 4.22–5.81)
RDW: 13.3 % (ref 11.5–15.5)
WBC: 4.2 10*3/uL (ref 4.0–10.5)
nRBC: 0 % (ref 0.0–0.2)

## 2020-07-12 LAB — COMPREHENSIVE METABOLIC PANEL
ALT: 21 U/L (ref 0–44)
AST: 46 U/L — ABNORMAL HIGH (ref 15–41)
Albumin: 2.9 g/dL — ABNORMAL LOW (ref 3.5–5.0)
Alkaline Phosphatase: 78 U/L (ref 38–126)
Anion gap: 7 (ref 5–15)
BUN: 23 mg/dL (ref 8–23)
CO2: 23 mmol/L (ref 22–32)
Calcium: 8.8 mg/dL — ABNORMAL LOW (ref 8.9–10.3)
Chloride: 108 mmol/L (ref 98–111)
Creatinine, Ser: 0.86 mg/dL (ref 0.61–1.24)
GFR, Estimated: >60 mL/min (ref >60)
Glucose, Bld: 144 mg/dL — ABNORMAL HIGH (ref 70–99)
Potassium: 3.9 mmol/L (ref 3.5–5.1)
Sodium: 138 mmol/L (ref 135–145)
Total Bilirubin: 0.7 mg/dL (ref 0.3–1.2)
Total Protein: 6.7 g/dL (ref 6.5–8.1)

## 2020-07-12 LAB — FERRITIN: Ferritin: 187 ng/mL (ref 24–336)

## 2020-07-12 LAB — MAGNESIUM: Magnesium: 2 mg/dL (ref 1.7–2.4)

## 2020-07-12 LAB — PHOSPHORUS: Phosphorus: 3.5 mg/dL (ref 2.5–4.6)

## 2020-07-12 LAB — PROCALCITONIN: Procalcitonin: <0.1 ng/mL

## 2020-07-12 LAB — D-DIMER, QUANTITATIVE: D-Dimer, Quant: 2.43 ug/mL-FEU — ABNORMAL HIGH (ref 0.00–0.50)

## 2020-07-12 LAB — C-REACTIVE PROTEIN: CRP: 7.3 mg/dL — ABNORMAL HIGH (ref <1.0)

## 2020-07-12 MED ORDER — MUPIROCIN 2 % EX OINT
TOPICAL_OINTMENT | Freq: Two times a day (BID) | CUTANEOUS | Status: DC
  Filled 2020-07-12: qty 22

## 2020-07-12 NOTE — Plan of Care (Signed)

## 2020-07-12 NOTE — Evaluation (Signed)
Physical Therapy Evaluation Patient Details Name: Randall Schneider MRN: [id number] DOB: [birth year] Today's Date: 07/12/2020   History of Present Illness  87 y.o. male with a history of dementia, HTN, HLD who presented from home by EMS to the ED 5/2 for fever, decreased oral intake and generalized weakness having tested positive for covid by home test the previous day. SARS-CoV-2 PCR was confirmed to be positive. CXR demonstrated ill-defined left lower airspace opacity. He was noted to be 90% on room air and given 2L O2 for this, admitted for covid-19 pneumonia, started on remdesivir, decadron.  Clinical Impression  Pt pleasantly confused but willing to participate with all requested acts.  Son present and admits that it is getting more and more difficult to manage at home but that the family wants to do all they can to try support and and keep him at home.  Son reports that just a few months ago he was able to ambulate long distances w/o assist, but recently he has been having more and more difficult.  Pt has been having PT at home which has produced some improvement, they are interested in continuing with this as well as looking to increase some outside assistance as possible.      Follow Up Recommendations Supervision/Assistance - 24 hour;Home health PT    Equipment Recommendations  None recommended by PT    Recommendations for Other Services       Precautions / Restrictions Precautions Precautions: Fall Restrictions Weight Bearing Restrictions: No      Mobility  Bed Mobility Overal bed mobility: Needs Assistance Bed Mobility: Supine to Sit;Sit to Supine     Supine to sit: Mod assist Sit to supine: Max assist   General bed mobility comments: per baseline he is able to use the rail to roll and work toward EOB but unable to lift trunk or get to sitting    Transfers Overall transfer level: Needs assistance Equipment used: 1 person hand held assist Transfers: Sit to/from  Stand Sit to Stand: Mod assist;Min assist         General transfer comment: Per son pt does have chronic lean back to to L, needed assist from L to keep weight forward/centered and on the walker.  Unable to maintain without at least some assist for more than a few seconds  Ambulation/Gait Ambulation/Gait assistance: Min assist Gait Distance (Feet): 3 Feet Assistive device: Rolling walker (2 wheeled)       General Gait Details: Pt struggled to organize small side steps along EOB to get to the recliner, leaning heavily to the L and needing direct assist to maintain upright  Stairs            Wheelchair Mobility    Modified Rankin (Stroke Patients Only)       Balance Overall balance assessment: Needs assistance Sitting-balance support: Feet supported Sitting balance-Leahy Scale: Fair Sitting balance - Comments: progressing to min guard but needs cuing secondary to posterior bias and L lateral lean   Standing balance support: Bilateral upper extremity supported Standing balance-Leahy Scale: Poor Standing balance comment: left and posterior bias                             Pertinent Vitals/Pain Pain Assessment: No/denies pain    Home Living Family/patient expects to be discharged to:: Private residence Living Arrangements: Spouse/significant other Available Help at Discharge: Family;Available 24 hours/day Type of Home: House Home Access: Ramped entrance  Home Layout: One level Home Equipment: Walker - 2 wheels      Prior Function Level of Independence: Needs assistance   Gait / Transfers Assistance Needed: Pt ambulates short distances with RW and always with family assist  ADL's / Homemaking Assistance Needed: Wife and personal care aide( comes 3x/wk) assist pt with bathing and dressing from bed level - mod A.  Comments: Pt with recent SNF admission ~ 1 month ago and has been getting [facility] and PT at home.     Hand Dominance   Dominant  Hand: Right    Extremity/Trunk Assessment   Upper Extremity Assessment Upper Extremity Assessment: Generalized weakness    Lower Extremity Assessment Lower Extremity Assessment: Generalized weakness       Communication   Communication: HOH  Cognition Arousal/Alertness: Awake/alert Behavior During Therapy: WFL for tasks assessed/performed Overall Cognitive Status: History of cognitive impairments - at baseline                                 General Comments: Pt very pleasant and oriented to self and location. Does not answer PLOF questions correctly, knows son in room      General Comments      Exercises     Assessment/Plan    PT Assessment Patient needs continued PT services  PT Problem List Decreased strength;Decreased range of motion;Decreased activity tolerance;Decreased balance;Decreased mobility;Decreased coordination;Decreased knowledge of use of DME;Decreased cognition;Decreased safety awareness       PT Treatment Interventions DME instruction;Gait training;Stair training;Functional mobility training;Therapeutic activities;Therapeutic exercise;Neuromuscular re-education;Balance training;Patient/family education    PT Goals (Current goals can be found in the Care Plan section)  Acute Rehab PT Goals Patient Stated Goal: to return home and get stronger PT Goal Formulation: With patient/family Time For Goal Achievement: 07/26/20    Frequency Min 2X/week   Barriers to discharge        Co-evaluation               AM-PAC PT "6 Clicks" Mobility  Outcome Measure Help needed turning from your back to your side while in a flat bed without using bedrails?: A Lot Help needed moving from lying on your back to sitting on the side of a flat bed without using bedrails?: A Lot Help needed moving to and from a bed to a chair (including a wheelchair)?: A Lot Help needed standing up from a chair using your arms (e.g., wheelchair or bedside chair)?: A  Lot Help needed to walk in hospital room?: A Lot Help needed climbing 3-5 steps with a railing? : A Lot 6 Click Score: 12    End of Session Equipment Utilized During Treatment: Gait belt Activity Tolerance: Patient tolerated treatment well Patient left: with call bell/phone within reach;with chair alarm set;with family/visitor present Nurse Communication: Mobility status PT Visit Diagnosis: Other abnormalities of gait and mobility (R26.89);Muscle weakness (generalized) (M62.81);Unsteadiness on feet (R26.81)    Time: 3151-7616 PT Time Calculation (min) (ACUTE ONLY): 36 min   Charges:   PT Evaluation $PT Eval Low Complexity: 1 Low PT Treatments $Therapeutic Activity: 8-22 mins        Kreg Shropshire, DPT 07/12/2020, 6:19 PM

## 2020-07-12 NOTE — Progress Notes (Signed)
PROGRESS NOTE  Randall Schneider  STM:[id number] DOB: 1930-08-09 DOA: 07/11/2020 PCP: Sofie Hartigan, MD   Brief Narrative: Randall Schneider is an 87 y.o. male with a history of dementia, HTN, HLD who presented from home by EMS to the ED 5/2 for fever, decreased oral intake and generalized weakness having tested positive for covid by home test the previous day. SARS-CoV-2 PCR was confirmed to be positive. CXR demonstrated ill-defined left lower airspace opacity. He was noted to be 90% on room air and given 2L O2 for this, admitted for covid-19 pneumonia, started on remdesivir, decadron.   Assessment & Plan: Principal Problem:   Pneumonia due to COVID-19 virus Active Problems:   Weakness   Essential hypertension   Dementia (HCC)   Acute respiratory failure due to COVID-19 [facility])  Covid-19 pneumonia: Acute respiratory failure thus far is ruled out as he's had no SpO2 <90% on room air.  - Wean O2, anticipate ability to come off - CRP 7.3, PCT negative x2.  - Continue remdesivir and decadron. Rationale of these medications and omission of other medications from the care plan was discussed. - OOB, FV, IS, no need to prone at this time.  - Continue isolation x10 days per protocol. - Lovenox VTE ppx  Thrombocytopenia: Mild. No bleeding or purpura. Likely due to viral illness.  - Monitor. Ok to use VTE ppx.  Elevated AST: Very mild, likely related to viral infection.  - Monitor. ALT is wnl, not a contraindication to remdesivir.  Generalized weakness: As a result of viral infection.  - Supportive care, PT/OT consulted  Dementia:  - Delirium precautions - Son at bedside  Eschar: On lower leg, does not currently appear infected.  - Topical management at this time.  DVT prophylaxis: Lovenox Code Status: Full Family Communication: Son at bedside and daughter by speakerphone Disposition Plan:  Status is: Inpatient  Remains inpatient appropriate because:Inpatient level of care  appropriate due to severity of illness   Dispo: The patient is from: Home              Anticipated d/c is to: Home              Patient currently is not medically stable to d/c.   Difficult to place patient No  Consultants:   None  Procedures:   None  Antimicrobials:  Remdesivir 5/2 - 5/6   Subjective: Pt denies dyspnea, eating breakfast from fast food without nausea or vomiting. Not OOB since arrival, still feels weak. Mentation is near his baseline. Using FV with some production with cough. Using IS pulling ~1,500cc.  Objective: Vitals:   07/11/20 2114 07/12/20 0113 07/12/20 0528 07/12/20 0900  BP: (!) 148/72 125/66 (!) 154/73 (!) 161/85  Pulse: 64 (!) 59 (!) 57 75  Resp: 20 18 16  (!) 22  Temp: 98.1 F (36.7 C) 98 F (36.7 C) 98 F (36.7 C) (!) 97.5 F (36.4 C)  TempSrc: Oral Oral Oral Oral  SpO2: 97% 96% 99% 97%  Weight:      Height:        Intake/Output Summary (Last 24 hours) at 07/12/2020 1249 Last data filed at 07/12/2020 0532 Gross per 24 hour  Intake 168.63 ml  Output 575 ml  Net -406.37 ml   Filed Weights   07/11/20 0923  Weight: 84.4 kg    Gen: Pleasantly confused elderly male in no distress Pulm: Non-labored breathing. Scant crackles at bases. CV: Regular rate and rhythm. No murmur, rub, or gallop. No  JVD, no pedal edema. GI: Abdomen soft, non-tender, non-distended, with normoactive bowel sounds. No organomegaly or masses felt. Ext: Warm, dry, palpable DP pulses, no hair growth on legs.  Skin: Small eschar on medial RLE without discharge or tenderness.  Neuro: Alert and disoriented. Diffusely weak, follows commands. Psych: Judgement and insight appear impaired. Mood & affect appropriate.   Data Reviewed: I have personally reviewed following labs and imaging studies  CBC: Recent Labs  Lab 07/11/20 0935 07/12/20 0627  WBC 4.9 4.2  NEUTROABS  --  3.0  HGB 12.3* 12.0*  HCT 37.2* 36.1*  MCV 89.6 90.9  PLT 155 161*   Basic Metabolic  Panel: Recent Labs  Lab 07/11/20 0935 07/12/20 0627  NA 136 138  K 3.5 3.9  CL 105 108  CO2 23 23  GLUCOSE 106* 144*  BUN 20 23  CREATININE 0.80 0.86  CALCIUM 8.7* 8.8*  MG  --  2.0  PHOS  --  3.5   GFR: Estimated Creatinine Clearance: 60.5 mL/min (by C-G formula based on SCr of 0.86 mg/dL). Liver Function Tests: Recent Labs  Lab 07/12/20 0627  AST 46*  ALT 21  ALKPHOS 78  BILITOT 0.7  PROT 6.7  ALBUMIN 2.9*   No results for input(s): LIPASE, AMYLASE in the last 168 hours. No results for input(s): AMMONIA in the last 168 hours. Coagulation Profile: No results for input(s): INR, PROTIME in the last 168 hours. Cardiac Enzymes: No results for input(s): CKTOTAL, CKMB, CKMBINDEX, TROPONINI in the last 168 hours. BNP (last 3 results) No results for input(s): PROBNP in the last 8760 hours. HbA1C: No results for input(s): HGBA1C in the last 72 hours. CBG: No results for input(s): GLUCAP in the last 168 hours. Lipid Profile: No results for input(s): CHOL, HDL, LDLCALC, TRIG, CHOLHDL, LDLDIRECT in the last 72 hours. Thyroid Function Tests: No results for input(s): TSH, T4TOTAL, FREET4, T3FREE, THYROIDAB in the last 72 hours. Anemia Panel: Recent Labs    07/12/20 0627  FERRITIN 187   Urine analysis:    Component Value Date/Time   COLORURINE YELLOW (A) 06/15/2020 2139   APPEARANCEUR TURBID (A) 06/15/2020 2139   LABSPEC 1.017 06/15/2020 2139   PHURINE 6.0 06/15/2020 2139   GLUCOSEU NEGATIVE 06/15/2020 2139   HGBUR MODERATE (A) 06/15/2020 2139   BILIRUBINUR NEGATIVE 06/15/2020 2139   KETONESUR 5 (A) 06/15/2020 2139   PROTEINUR 100 (A) 06/15/2020 2139   NITRITE POSITIVE (A) 06/15/2020 2139   LEUKOCYTESUR MODERATE (A) 06/15/2020 2139   Recent Results (from the past 240 hour(s))  Blood culture (single)     Status: None (Preliminary result)   Collection Time: 07/11/20  9:36 AM   Specimen: BLOOD  Result Value Ref Range Status   Specimen Description BLOOD BLOOD  RIGHT FOREARM  Final   Special Requests   Final    BOTTLES DRAWN AEROBIC AND ANAEROBIC Blood Culture results may not be optimal due to an excessive volume of blood received in culture bottles   Culture   Final    NO GROWTH < 24 HOURS Performed at [facility], [address], [place], [place] [address]    Report Status PENDING  Incomplete  Resp Panel by RT-PCR (Flu A&B, Covid) Nasopharyngeal Swab     Status: Abnormal   Collection Time: 07/11/20  9:51 AM   Specimen: Nasopharyngeal Swab; Nasopharyngeal(NP) swabs in vial transport medium  Result Value Ref Range Status   SARS Coronavirus 2 by RT PCR POSITIVE (A) NEGATIVE Final    Comment:  RESULT CALLED TO, READ BACK BY AND VERIFIED WITH: DEE MCCLAIN AT 3664 ON 07/11/2020 [place]. (NOTE) SARS-CoV-2 target nucleic acids are DETECTED.  The SARS-CoV-2 RNA is generally detectable in upper respiratory specimens during the acute phase of infection. Positive results are indicative of the presence of the identified virus, but do not rule out bacterial infection or co-infection with other pathogens not detected by the test. Clinical correlation with patient history and other diagnostic information is necessary to determine patient infection status. The expected result is Negative.  Fact Sheet for Patients: EntrepreneurPulse.com.au  Fact Sheet for Healthcare Providers: IncredibleEmployment.be  This test is not yet approved or cleared by the [place] FDA and  has been authorized for detection and/or diagnosis of SARS-CoV-2 by FDA under an Emergency Use Authorization (EUA).  This EUA will remain in effect (meaning this test can  be used) for the duration of  the COVID-19 declaration under Section 564(b)(1) of the Act, 21 U.S.C. section 360bbb-3(b)(1), unless the authorization is terminated or revoked sooner.     Influenza A by PCR NEGATIVE NEGATIVE Final   Influenza B by PCR NEGATIVE NEGATIVE Final     Comment: (NOTE) The Xpert Xpress SARS-CoV-2/FLU/RSV plus assay is intended as an aid in the diagnosis of influenza from Nasopharyngeal swab specimens and should not be used as a sole basis for treatment. Nasal washings and aspirates are unacceptable for Xpert Xpress SARS-CoV-2/FLU/RSV testing.  Fact Sheet for Patients: EntrepreneurPulse.com.au  Fact Sheet for Healthcare Providers: IncredibleEmployment.be  This test is not yet approved or cleared by the [place] FDA and has been authorized for detection and/or diagnosis of SARS-CoV-2 by FDA under an Emergency Use Authorization (EUA). This EUA will remain in effect (meaning this test can be used) for the duration of the COVID-19 declaration under Section 564(b)(1) of the Act, 21 U.S.C. section 360bbb-3(b)(1), unless the authorization is terminated or revoked.  Performed at [facility], [address], [place], [place] [address]       Radiology Studies: DG Chest Portable 1 View  Result Date: 07/11/2020 CLINICAL DATA:  Reported COVID-19 positive.  Weakness. EXAM: PORTABLE CHEST 1 VIEW COMPARISON:  June 15, 2020 FINDINGS: There is ill-defined airspace opacity in the left lower lobe. The lungs elsewhere are clear. Heart size and pulmonary vascular normal. No adenopathy. There is degenerative change in each shoulder. 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 IMPRESSION: Ill-defined airspace opacity left lower lobe concerning for pneumonia. Atypical organism pneumonia may present in this manner. No consolidation. Lungs otherwise clear. Heart size normal.  Arthropathy in each shoulder. Electronically Signed   By: Lowella Grip III M.D.   On: 07/11/2020 10:45    Scheduled Meds: . albuterol  2 puff Inhalation Q6H  . vitamin C  500 mg Oral Daily  . aspirin EC  81 mg Oral Daily  . atorvastatin  10 mg Oral Daily  . dexamethasone (DECADRON) injection  6 mg  Intravenous Q24H  . donepezil  10 mg Oral Daily  . doxazosin  2 mg Oral QHS  . enoxaparin (LOVENOX) injection  40 mg Subcutaneous Q24H  . FLUoxetine  10 mg Oral Daily  . zinc sulfate  220 mg Oral Daily   Continuous Infusions: . remdesivir 100 mg in NS 100 mL 100 mg (07/12/20 1018)     LOS: 1 day   Time spent: 25 minutes.  Patrecia Pour, MD [facility] [email or web address] 07/12/2020, 12:49 PM

## 2020-07-12 NOTE — Progress Notes (Signed)
At beginning of shift son is at bedside.  MD in to examine patient.  I at that time made MD aware of son's concerns regarding pulse ox, RT and Lovenox.  Patient at that time remained on oxygen.  OT in to see patient and decreased oxygen to 1L.  Vitals checked and oxygen wnl.  Oxygen removed and pulse ox checked patient was sating in high 90's.  Son became very upset that the oxygen was off.  Explained to him that patient was doing much better, coughing less and appeared to be in no distress.  Son agreed.  Later this shift son discussed issue with charge nurse - continual pulse ox ordered.

## 2020-07-12 NOTE — Evaluation (Signed)
Occupational Therapy Evaluation Patient Details Name: Randall Schneider MRN: [id number] DOB: [birth year] Today's Date: 07/12/2020    History of Present Illness 87 y.o. male with a history of dementia, HTN, HLD who presented from home by EMS to the ED 5/2 for fever, decreased oral intake and generalized weakness having tested positive for covid by home test the previous day. SARS-CoV-2 PCR was confirmed to be positive. CXR demonstrated ill-defined left lower airspace opacity. He was noted to be 90% on room air and given 2L O2 for this, admitted for covid-19 pneumonia, started on remdesivir, decadron.   Clinical Impression   Patient presenting with decreased I in self care, balance, functional mobility/transfer, endurance, and safety awareness.  Patient's son present in the room to confirm baseline. Pt lives at home with wife PTA. His wife assists as well as an aide (3x/wk) with self care tasks. Bathing and dressing from hospital bed at home. Pt ambulates short distances with RW and min A from family. Pt needing mod - max A for sit <>supine and noted to have posterior bias in standing and sitting which so reports is baseline. Pt also with L lateral lean in sitting. Pt does take a few lateral steps along EOB with mod A for safety and balance.  Set up A to wash face while seated EOB with min A for sitting balance. Pt's son, Randall Schneider, lives next door and assists as needed. He called pt's wife on phone and she had no further questions and was agreeable to plan.   Patient will benefit from acute OT to increase overall independence in the areas of ADLs, functional mobility, and safety awareness in order to safely discharge home with family.   Follow Up Recommendations  Home health OT;Supervision/Assistance - 24 hour    Equipment Recommendations  None recommended by OT       Precautions / Restrictions Precautions Precautions: Fall      Mobility Bed Mobility Overal bed mobility: Needs Assistance Bed  Mobility: Supine to Sit;Sit to Supine     Supine to sit: Mod assist Sit to supine: Max assist   General bed mobility comments: min - mod cuing for hand placement and technique.    Transfers Overall transfer level: Needs assistance Equipment used: 1 person hand held assist Transfers: Sit to/from Stand Sit to Stand: Mod assist              Balance Overall balance assessment: Needs assistance Sitting-balance support: Feet supported Sitting balance-Leahy Scale: Fair Sitting balance - Comments: progressing to min guard but needs cuing secondary to posterior bias and L lateral lean   Standing balance support: Bilateral upper extremity supported Standing balance-Leahy Scale: Poor Standing balance comment: posterior bias                           ADL either performed or assessed with clinical judgement   ADL Overall ADL's : Needs assistance/impaired     Grooming: Wash/dry hands;Wash/dry face;Sitting;Set up;Minimal assistance Grooming Details (indicate cue type and reason): min A for sitting balance secondary to posterior bias             Lower Body Dressing: Total assistance;Sit to/from stand Lower Body Dressing Details (indicate cue type and reason): to don B socks                     Vision Patient Visual Report: No change from baseline  Pertinent Vitals/Pain Pain Assessment: No/denies pain     Hand Dominance Right   Extremity/Trunk Assessment Upper Extremity Assessment Upper Extremity Assessment: Generalized weakness (limited shoulder elevation but AROM is WFLs)   Lower Extremity Assessment Lower Extremity Assessment: Defer to PT evaluation       Communication Communication Communication: HOH   Cognition Arousal/Alertness: Awake/alert Behavior During Therapy: WFL for tasks assessed/performed Overall Cognitive Status: History of cognitive impairments - at baseline                                  General Comments: Pt very pleasant and oriented to self and location. Pt knows son in room. Pt does not answer PLOF questions correctly and is often corrected by son.              Home Living Family/patient expects to be discharged to:: Private residence Living Arrangements: Spouse/significant other Available Help at Discharge: Family;Available 24 hours/day Type of Home: House Home Access: Ramped entrance     Home Layout: One level               Home Equipment: Walker - 2 wheels          Prior Functioning/Environment Level of Independence: Needs assistance  Gait / Transfers Assistance Needed: Pt ambulates short distances with RW and min family assist ADL's / Homemaking Assistance Needed: Wife and personal care aide( comes 3x/wk) assist pt with bathing and dressing from bed level - mod A.   Comments: Pt with recent SNF admission ~ 1 month ago and has been getting [facility] and PT at home.        OT Problem List: Decreased strength;Decreased activity tolerance;Impaired balance (sitting and/or standing);Decreased coordination;Decreased cognition;Cardiopulmonary status limiting activity;Decreased safety awareness;Decreased knowledge of use of DME or AE;Impaired UE functional use      OT Treatment/Interventions: Self-care/ADL training;Manual therapy;Therapeutic exercise;Modalities;Balance training;Energy conservation;Therapeutic activities;Cognitive remediation/compensation;DME and/or AE instruction    OT Goals(Current goals can be found in the care plan section) Acute Rehab OT Goals Patient Stated Goal: to return home and get stronger OT Goal Formulation: With patient/family Time For Goal Achievement: 07/26/20 Potential to Achieve Goals: Good ADL Goals Pt Will Perform Grooming: with supervision;sitting Pt Will Perform Lower Body Bathing: with mod assist Pt Will Transfer to Toilet: with min assist Pt Will Perform Toileting - Clothing Manipulation and hygiene: with min  assist  OT Frequency: Min 2X/week   Barriers to D/C:    none known at this time          AM-PAC OT "6 Clicks" Daily Activity     Outcome Measure Help from another person eating meals?: A Little Help from another person taking care of personal grooming?: A Little Help from another person toileting, which includes using toliet, bedpan, or urinal?: Total Help from another person bathing (including washing, rinsing, drying)?: A Lot Help from another person to put on and taking off regular upper body clothing?: A Little Help from another person to put on and taking off regular lower body clothing?: A Lot 6 Click Score: 14   End of Session Nurse Communication: Mobility status;Other (comment) (O2 decreased to 1 L)  Activity Tolerance: Patient tolerated treatment well Patient left: in bed;with call bell/phone within reach;with bed alarm set;with family/visitor present  OT Visit Diagnosis: Unsteadiness on feet (R26.81);Repeated falls (R29.6);Muscle weakness (generalized) (M62.81);History of falling (Z91.81)  Time: 4540-9811 OT Time Calculation (min): 37 min Charges:  OT General Charges $OT Visit: 1 Visit OT Evaluation $OT Eval Moderate Complexity: 1 Mod OT Treatments $Self Care/Home Management : 8-22 mins $Therapeutic Activity: 8-22 mins  Darleen Crocker, MS, OTR/L , CBIS ascom [phone]  07/12/20, 4:16 PM

## 2020-07-13 LAB — COMPREHENSIVE METABOLIC PANEL
ALT: 20 U/L (ref 0–44)
AST: 38 U/L (ref 15–41)
Albumin: 3 g/dL — ABNORMAL LOW (ref 3.5–5.0)
Alkaline Phosphatase: 77 U/L (ref 38–126)
Anion gap: 7 (ref 5–15)
BUN: 29 mg/dL — ABNORMAL HIGH (ref 8–23)
CO2: 23 mmol/L (ref 22–32)
Calcium: 8.7 mg/dL — ABNORMAL LOW (ref 8.9–10.3)
Chloride: 106 mmol/L (ref 98–111)
Creatinine, Ser: 0.79 mg/dL (ref 0.61–1.24)
GFR, Estimated: >60 mL/min (ref >60)
Glucose, Bld: 130 mg/dL — ABNORMAL HIGH (ref 70–99)
Potassium: 3.9 mmol/L (ref 3.5–5.1)
Sodium: 136 mmol/L (ref 135–145)
Total Bilirubin: 0.6 mg/dL (ref 0.3–1.2)
Total Protein: 6.7 g/dL (ref 6.5–8.1)

## 2020-07-13 LAB — CBC WITH DIFFERENTIAL/PLATELET
Abs Immature Granulocytes: 0.02 10*3/uL (ref 0.00–0.07)
Basophils Absolute: 0 10*3/uL (ref 0.0–0.1)
Basophils Relative: 0 %
Eosinophils Absolute: 0 10*3/uL (ref 0.0–0.5)
Eosinophils Relative: 0 %
HCT: 35.1 % — ABNORMAL LOW (ref 39.0–52.0)
Hemoglobin: 11.6 g/dL — ABNORMAL LOW (ref 13.0–17.0)
Immature Granulocytes: 0 %
Lymphocytes Relative: 17 %
Lymphs Abs: 0.8 10*3/uL (ref 0.7–4.0)
MCH: 29.9 pg (ref 26.0–34.0)
MCHC: 33 g/dL (ref 30.0–36.0)
MCV: 90.5 fL (ref 80.0–100.0)
Monocytes Absolute: 0.4 10*3/uL (ref 0.1–1.0)
Monocytes Relative: 9 %
Neutro Abs: 3.7 10*3/uL (ref 1.7–7.7)
Neutrophils Relative %: 74 %
Platelets: 154 10*3/uL (ref 150–400)
RBC: 3.88 MIL/uL — ABNORMAL LOW (ref 4.22–5.81)
RDW: 13.5 % (ref 11.5–15.5)
WBC: 5 10*3/uL (ref 4.0–10.5)
nRBC: 0 % (ref 0.0–0.2)

## 2020-07-13 LAB — C-REACTIVE PROTEIN: CRP: 3.3 mg/dL — ABNORMAL HIGH (ref <1.0)

## 2020-07-13 MED ORDER — SODIUM CHLORIDE 0.9 % IV SOLN: INTRAVENOUS | Status: AC

## 2020-07-13 NOTE — Plan of Care (Signed)

## 2020-07-13 NOTE — Progress Notes (Signed)
Patient has not urinate since am, done a bladder scan and it showed 265 ml. Provider is informed.  Order taken from provider, patient need to stand up and urinate. Will follow the order

## 2020-07-13 NOTE — Progress Notes (Signed)
500 ml out via I/O cath per provider order.

## 2020-07-13 NOTE — TOC Progression Note (Signed)
Transition of Care [facility]) - Progression Note    Patient Details  Name: Randall Schneider MRN: [id number] Date of Birth: [birth year]  Transition of Care [facility]) CM/SW Contact  Beverly Sessions, RN Phone Number: 07/13/2020, 2:47 PM  Clinical Narrative:      Spoke with wife and son via phone.  They both confirm plan is for patient to return home with wife at discharge. Printed PCS service list to provide to son.  Due to covid isolation Bedside RN will take into the room.   Wife states that she is still working with a Chief Executive Officer outpatient to obtain medicaid for patient       Expected Discharge Plan and Services                                                 Social Determinants of Health (SDOH) Interventions    Readmission Risk Interventions No flowsheet data found.

## 2020-07-13 NOTE — Progress Notes (Signed)
PROGRESS NOTE    [name]  HKV:[id number] DOB: 1930-04-29 DOA: 07/11/2020 PCP: Randall Hartigan, MD   Chief complaint.  Generalized weakness. Brief Narrative:  Randall Schneider is an 87 y.o. male with a history of dementia, HTN, HLD who presented from home by EMS to the ED 5/2 for fever, decreased oral intake and generalized weakness having tested positive for covid by home test the previous day. SARS-CoV-2 PCR was confirmed to be positive. CXR demonstrated ill-defined left lower airspace opacity. He was noted to be 90% on room air and given 2L O2 for this, admitted for covid-19 pneumonia, started on remdesivir, decadron.    Assessment & Plan:   Principal Problem:   Pneumonia due to COVID-19 virus Active Problems:   Weakness   Essential hypertension   Dementia ([place])  #1.  COVID-19 pneumonia. Patient does not have significant hypoxemia, he is currently treated with remdesivir and Decadron. Patient still has poor appetite, limited p.o. intake.  He has not been making any urine today, bladder scan showed 265 mL residual.  We will continue monitor, encourage p.o. intake.  I also give 500 mL of normal saline.  #2.  Mild thrombocytopenia secondary to COVID-19 infection. Continue to follow.  3.  Dementia. Follow.    DVT prophylaxis: Lovenox Code Status: Full Family Communication: Son updated at bedside. Disposition Plan:  .   Status is: Inpatient  Remains inpatient appropriate because:Inpatient level of care appropriate due to severity of illness   Dispo: The patient is from: Home              Anticipated d/c is to: Home              Patient currently is not medically stable to d/c.   Difficult to place patient No        I/O last 3 completed shifts: In: 460 [P.O.:360; IV Piggyback:100] Out: 1875 [Urine:1875] No intake/output data recorded.     Consultants:   None  Procedures: None  Antimicrobials:  None  Subjective: Patient still has significant  poor appetite, no nausea vomiting.  He has decreased urine output, he was able to drink some water.  Spoke with the nurse, patient has a residual of 265 mL in the bladder. Denies any short of breath or cough. No fever or chills. No chest pain or palpitation. No dysuria hematuria.  Objective: Vitals:   07/12/20 2113 07/13/20 0104 07/13/20 0433 07/13/20 0848  BP: 140/76 (!) 143/70 (!) 149/77 138/70  Pulse: 63 (!) 57 (!) 59 60  Resp: 16 20 18 18   Temp: 98.6 F (37 C) 98.3 F (36.8 C) 98.4 F (36.9 C) 97.7 F (36.5 C)  TempSrc: Oral Oral Oral Oral  SpO2: 94% 93% 94% 97%  Weight:      Height:        Intake/Output Summary (Last 24 hours) at 07/13/2020 1358 Last data filed at 07/13/2020 0500 Gross per 24 hour  Intake 460.04 ml  Output 1300 ml  Net -839.96 ml   Filed Weights   07/11/20 0923  Weight: 84.4 kg    Examination:  General exam: Appears calm and comfortable  Respiratory system: Clear to auscultation. Respiratory effort normal. Cardiovascular system: S1 & S2 heard, RRR. No JVD, murmurs, rubs, gallops or clicks. No pedal edema. Gastrointestinal system: Abdomen is nondistended, soft and nontender. No organomegaly or masses felt. Normal bowel sounds heard. Central nervous system: Alert and oriented x2. No focal neurological deficits. Extremities: Symmetric 5 x 5 power. Skin:  No rashes, lesions or ulcers Psychiatry: . Mood & affect appropriate.     Data Reviewed: I have personally reviewed following labs and imaging studies  CBC: Recent Labs  Lab 07/11/20 0935 07/12/20 0627 07/13/20 0527  WBC 4.9 4.2 5.0  NEUTROABS  --  3.0 3.7  HGB 12.3* 12.0* 11.6*  HCT 37.2* 36.1* 35.1*  MCV 89.6 90.9 90.5  PLT 155 141* 326   Basic Metabolic Panel: Recent Labs  Lab 07/11/20 0935 07/12/20 0627 07/13/20 0527  NA 136 138 136  K 3.5 3.9 3.9  CL 105 108 106  CO2 23 23 23   GLUCOSE 106* 144* 130*  BUN 20 23 29*  CREATININE 0.80 0.86 0.79  CALCIUM 8.7* 8.8* 8.7*  MG  --   2.0  --   PHOS  --  3.5  --    GFR: Estimated Creatinine Clearance: 65.1 mL/min (by C-G formula based on SCr of 0.79 mg/dL). Liver Function Tests: Recent Labs  Lab 07/12/20 0627 07/13/20 0527  AST 46* 38  ALT 21 20  ALKPHOS 78 77  BILITOT 0.7 0.6  PROT 6.7 6.7  ALBUMIN 2.9* 3.0*   No results for input(s): LIPASE, AMYLASE in the last 168 hours. No results for input(s): AMMONIA in the last 168 hours. Coagulation Profile: No results for input(s): INR, PROTIME in the last 168 hours. Cardiac Enzymes: No results for input(s): CKTOTAL, CKMB, CKMBINDEX, TROPONINI in the last 168 hours. BNP (last 3 results) No results for input(s): PROBNP in the last 8760 hours. HbA1C: No results for input(s): HGBA1C in the last 72 hours. CBG: No results for input(s): GLUCAP in the last 168 hours. Lipid Profile: No results for input(s): CHOL, HDL, LDLCALC, TRIG, CHOLHDL, LDLDIRECT in the last 72 hours. Thyroid Function Tests: No results for input(s): TSH, T4TOTAL, FREET4, T3FREE, THYROIDAB in the last 72 hours. Anemia Panel: Recent Labs    07/12/20 0627  FERRITIN 187   Sepsis Labs: Recent Labs  Lab 07/11/20 1124 07/12/20 0627  PROCALCITON <0.10 <0.10    Recent Results (from the past 240 hour(s))  Blood culture (single)     Status: None (Preliminary result)   Collection Time: 07/11/20  9:36 AM   Specimen: BLOOD  Result Value Ref Range Status   Specimen Description BLOOD BLOOD RIGHT FOREARM  Final   Special Requests   Final    BOTTLES DRAWN AEROBIC AND ANAEROBIC Blood Culture results may not be optimal due to an excessive volume of blood received in culture bottles   Culture   Final    NO GROWTH 2 DAYS Performed at [facility], [address] [place], [place] [address]    Report Status PENDING  Incomplete  Resp Panel by RT-PCR (Flu A&B, Covid) Nasopharyngeal Swab     Status: Abnormal   Collection Time: 07/11/20  9:51 AM   Specimen: Nasopharyngeal Swab;  Nasopharyngeal(NP) swabs in vial transport medium  Result Value Ref Range Status   SARS Coronavirus 2 by RT PCR POSITIVE (A) NEGATIVE Final    Comment: RESULT CALLED TO, READ BACK BY AND VERIFIED WITH: DEE MCCLAIN AT 8099 ON 07/11/2020 [place]. (NOTE) SARS-CoV-2 target nucleic acids are DETECTED.  The SARS-CoV-2 RNA is generally detectable in upper respiratory specimens during the acute phase of infection. Positive results are indicative of the presence of the identified virus, but do not rule out bacterial infection or co-infection with other pathogens not detected by the test. Clinical correlation with patient history and other diagnostic information is necessary to determine  patient infection status. The expected result is Negative.  Fact Sheet for Patients: EntrepreneurPulse.com.au  Fact Sheet for Healthcare Providers: IncredibleEmployment.be  This test is not yet approved or cleared by the [place] FDA and  has been authorized for detection and/or diagnosis of SARS-CoV-2 by FDA under an Emergency Use Authorization (EUA).  This EUA will remain in effect (meaning this test can  be used) for the duration of  the COVID-19 declaration under Section 564(b)(1) of the Act, 21 U.S.C. section 360bbb-3(b)(1), unless the authorization is terminated or revoked sooner.     Influenza A by PCR NEGATIVE NEGATIVE Final   Influenza B by PCR NEGATIVE NEGATIVE Final    Comment: (NOTE) The Xpert Xpress SARS-CoV-2/FLU/RSV plus assay is intended as an aid in the diagnosis of influenza from Nasopharyngeal swab specimens and should not be used as a sole basis for treatment. Nasal washings and aspirates are unacceptable for Xpert Xpress SARS-CoV-2/FLU/RSV testing.  Fact Sheet for Patients: EntrepreneurPulse.com.au  Fact Sheet for Healthcare Providers: IncredibleEmployment.be  This test is not yet approved or cleared by the  [place] FDA and has been authorized for detection and/or diagnosis of SARS-CoV-2 by FDA under an Emergency Use Authorization (EUA). This EUA will remain in effect (meaning this test can be used) for the duration of the COVID-19 declaration under Section 564(b)(1) of the Act, 21 U.S.C. section 360bbb-3(b)(1), unless the authorization is terminated or revoked.  Performed at [facility], [address], [place], [place] [address]          Radiology Studies: No results found.      Scheduled Meds: . albuterol  2 puff Inhalation Q6H  . vitamin C  500 mg Oral Daily  . aspirin EC  81 mg Oral Daily  . atorvastatin  10 mg Oral Daily  . dexamethasone (DECADRON) injection  6 mg Intravenous Q24H  . donepezil  10 mg Oral Daily  . doxazosin  2 mg Oral QHS  . enoxaparin (LOVENOX) injection  40 mg Subcutaneous Q24H  . FLUoxetine  10 mg Oral Daily  . mupirocin ointment   Topical BID  . zinc sulfate  220 mg Oral Daily   Continuous Infusions: . sodium chloride    . remdesivir 100 mg in NS 100 mL 100 mg (07/13/20 0905)     LOS: 2 days    Time spent: 32 minutes    Sharen Hones, MD [facility]   To contact the attending provider between 7A-7P or the covering provider during after hours 7P-7A, please log into the web site [email or web address] and access using universal [place] password for that web site. If you do not have the password, please call the hospital operator.  07/13/2020, 1:58 PM

## 2020-07-13 NOTE — Progress Notes (Signed)
Patient asked to stand up urinate, tried but he was not able to urinate much. Done bladder scan, it showed 313 ml. Family is aware, will notify the provider.

## 2020-07-13 NOTE — Progress Notes (Signed)
Patient very confused at beginning of shift, wanting to get out of room, but easily reoriented. Transferred from recliner to chair with 2 assist and walker, gait unsteady. Gave Robitussin DM 10 ml for cough at 450, this relieved cough, non productive. Patient has 0 mls in external urine bag container, bladder scanned 621 ml. Notified Dr. Sidney Ace and ordered in and out. 600 ml collected per order, 0 ml postresidual. Patient resting well at moment. Encouraged incentive spirometry, reaching 1000.

## 2020-07-14 LAB — COMPREHENSIVE METABOLIC PANEL
ALT: 22 U/L (ref 0–44)
AST: 34 U/L (ref 15–41)
Albumin: 2.9 g/dL — ABNORMAL LOW (ref 3.5–5.0)
Alkaline Phosphatase: 75 U/L (ref 38–126)
Anion gap: 7 (ref 5–15)
BUN: 24 mg/dL — ABNORMAL HIGH (ref 8–23)
CO2: 24 mmol/L (ref 22–32)
Calcium: 8.5 mg/dL — ABNORMAL LOW (ref 8.9–10.3)
Chloride: 106 mmol/L (ref 98–111)
Creatinine, Ser: 0.72 mg/dL (ref 0.61–1.24)
GFR, Estimated: >60 mL/min (ref >60)
Glucose, Bld: 93 mg/dL (ref 70–99)
Potassium: 4 mmol/L (ref 3.5–5.1)
Sodium: 137 mmol/L (ref 135–145)
Total Bilirubin: 0.8 mg/dL (ref 0.3–1.2)
Total Protein: 6.5 g/dL (ref 6.5–8.1)

## 2020-07-14 LAB — C-REACTIVE PROTEIN: CRP: 1.4 mg/dL — ABNORMAL HIGH (ref <1.0)

## 2020-07-14 LAB — CBC WITH DIFFERENTIAL/PLATELET
Abs Immature Granulocytes: 0.03 10*3/uL (ref 0.00–0.07)
Basophils Absolute: 0 10*3/uL (ref 0.0–0.1)
Basophils Relative: 0 %
Eosinophils Absolute: 0 10*3/uL (ref 0.0–0.5)
Eosinophils Relative: 0 %
HCT: 35.3 % — ABNORMAL LOW (ref 39.0–52.0)
Hemoglobin: 11.8 g/dL — ABNORMAL LOW (ref 13.0–17.0)
Immature Granulocytes: 0 %
Lymphocytes Relative: 24 %
Lymphs Abs: 1.7 10*3/uL (ref 0.7–4.0)
MCH: 30.2 pg (ref 26.0–34.0)
MCHC: 33.4 g/dL (ref 30.0–36.0)
MCV: 90.3 fL (ref 80.0–100.0)
Monocytes Absolute: 0.6 10*3/uL (ref 0.1–1.0)
Monocytes Relative: 9 %
Neutro Abs: 4.9 10*3/uL (ref 1.7–7.7)
Neutrophils Relative %: 67 %
Platelets: 135 10*3/uL — ABNORMAL LOW (ref 150–400)
RBC: 3.91 MIL/uL — ABNORMAL LOW (ref 4.22–5.81)
RDW: 13.3 % (ref 11.5–15.5)
WBC: 7.3 10*3/uL (ref 4.0–10.5)
nRBC: 0 % (ref 0.0–0.2)

## 2020-07-14 LAB — MAGNESIUM: Magnesium: 2 mg/dL (ref 1.7–2.4)

## 2020-07-14 MED ORDER — ALBUTEROL SULFATE HFA 108 (90 BASE) MCG/ACT IN AERS: 2.0000 | INHALATION_SPRAY | Freq: Four times a day (QID) | RESPIRATORY_TRACT | 0 refills | Status: DC

## 2020-07-14 MED ORDER — DEXAMETHASONE 6 MG PO TABS: 6.0000 mg | ORAL_TABLET | Freq: Every day | ORAL | 0 refills | Status: AC

## 2020-07-14 MED ORDER — ASCORBIC ACID 500 MG PO TABS: 500.0000 mg | ORAL_TABLET | Freq: Every day | ORAL | 0 refills | Status: AC

## 2020-07-14 MED ORDER — ZINC SULFATE 220 (50 ZN) MG PO CAPS: 220.0000 mg | ORAL_CAPSULE | Freq: Every day | ORAL | 0 refills | Status: AC

## 2020-07-14 NOTE — Discharge Summary (Signed)
Physician Discharge Summary  Patient ID: Randall Schneider MRN: [id number] DOB/AGE: 09-07-1930 87 y.o.  Admit date: 07/11/2020 Discharge date: 07/14/2020  Admission Diagnoses:  Discharge Diagnoses:  Principal Problem:   Pneumonia due to COVID-19 virus Active Problems:   Weakness   Essential hypertension   Dementia [facility])   Discharged Condition: good  Hospital Course:  Randall Schneider [facility] PF87 y.o.malewith a history of dementia, HTN, HLD who presented from home by EMS to the ED 5/2 for fever, decreased oral intake and generalized weakness having tested positive for covid by home test the previous day. SARS-CoV-2 PCR was confirmed to be positive. CXR demonstrated ill-defined left lower airspace opacity. He was noted to be 90% on room air and given 2L O2 for this, admitted for covid-19 pneumonia, started on remdesivir, decadron.  #1.  COVID-19 pneumonia. Patient does not have significant hypoxemia, he is currently treated with remdesivir and Decadron. Patient still has poor appetite, limited p.o. intake.  He is given IV fluids. Patient condition had improved today, he was able to urinate by himself, appetite improving.  No hypoxemia, he is medically stable to be discharged.  #2.  Mild thrombocytopenia secondary to COVID-19 infection. Continue to follow.  3.  Dementia. Follow.    Consults: None  Significant Diagnostic Studies:  PORTABLE CHEST 1 VIEW  COMPARISON:  June 15, 2020  FINDINGS: There is ill-defined airspace opacity in the left lower lobe. The lungs elsewhere are clear. Heart size and pulmonary vascular normal. No adenopathy. There is degenerative change in each shoulder. 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  IMPRESSION: Ill-defined airspace opacity left lower lobe concerning for pneumonia. Atypical organism pneumonia may present in this manner. No consolidation. Lungs otherwise clear.  Heart size normal.  Arthropathy in each  shoulder.   Electronically Signed   By: Lowella Grip III M.D.   On: 07/11/2020 10:45   Treatments: Remdesivir, Decadron.  Discharge Exam: Blood pressure 120/65, pulse (!) 57, temperature 97.9 F (36.6 C), temperature source Oral, resp. rate 18, height 5\' 9"  (1.753 m), weight 84.4 kg, SpO2 97 %. General appearance: alert and cooperative Resp: clear to auscultation bilaterally Cardio: regular rate and rhythm, S1, S2 normal, no murmur, click, rub or gallop GI: soft, non-tender; bowel sounds normal; no masses,  no organomegaly Extremities: extremities normal, atraumatic, no cyanosis or edema  Disposition: Discharge disposition: 01-Home or Self Care       Discharge Instructions    Diet - low sodium heart healthy   Complete by: As directed    Increase activity slowly   Complete by: As directed      Allergies as of 07/14/2020      Reactions   Sulfa Antibiotics    Unknown reaction "long time ago" Other reaction(s): Unknown Other reaction(s): UNKNOWN Unknown, happened when he was a child      Medication List    TAKE these medications   albuterol 108 (90 Base) MCG/ACT inhaler Commonly known as: VENTOLIN HFA Inhale 2 puffs into the lungs every 6 (six) hours.   ascorbic acid 500 MG tablet Commonly known as: VITAMIN C Take 1 tablet (500 mg total) by mouth daily for 10 days. Start taking on: Jul 15, 2020   aspirin 81 MG tablet Take 81 mg by mouth daily.   atorvastatin 10 MG tablet Commonly known as: LIPITOR Take 10 mg by mouth daily.   dexamethasone 6 MG tablet Commonly known as: DECADRON Take 1 tablet (6 mg total) by mouth daily for  6 days.   donepezil 10 MG tablet Commonly known as: ARICEPT Take 10 mg by mouth daily.   doxazosin 2 MG tablet Commonly known as: CARDURA Take 2 mg by mouth at bedtime.   FLUoxetine 10 MG tablet Commonly known as: PROZAC Take 10 mg by mouth daily.   zinc sulfate 220 (50 Zn) MG capsule Take 1 capsule (220 mg total) by  mouth daily for 10 days. Start taking on: Jul 15, 2020       Follow-up Information    Feldpausch, Chrissie Noa, MD Follow up in 1 week(s).   Specialty: Family Medicine Contact information: [place] [address] [phone]               Signed: Sharen Hones 07/14/2020, 10:08 AM

## 2020-07-14 NOTE — Plan of Care (Signed)

## 2020-07-14 NOTE — Progress Notes (Signed)
Physical Therapy Treatment Patient Details Name: Randall Schneider MRN: [id number] DOB: 1930/05/14 Today's Date: 07/14/2020    History of Present Illness 87 y.o. male with a history of dementia, HTN, HLD who presented from home by EMS to the ED 5/2 for fever, decreased oral intake and generalized weakness having tested positive for covid by home test the previous day. SARS-CoV-2 PCR was confirmed to be positive. CXR demonstrated ill-defined left lower airspace opacity. He was noted to be 90% on room air and given 2L O2 for this, admitted for covid-19 pneumonia, started on remdesivir, decadron.    PT Comments    Patient making improvement with functional independence with activity. Patient with improved standing balance this session and increased independence with functional transfers. Recommend to continue PT to maximize independence and decrease caregiver burden. Discharge recommendation remains appropriate and son in the room confirms he will be assisting at discharge.    Follow Up Recommendations  Supervision/Assistance - 24 hour;Home health PT     Equipment Recommendations  None recommended by PT    Recommendations for Other Services       Precautions / Restrictions Precautions Precautions: Fall Restrictions Weight Bearing Restrictions: No    Mobility  Bed Mobility               General bed mobility comments: did not observe    Transfers Overall transfer level: Needs assistance Equipment used: Rolling walker (2 wheeled) Transfers: Sit to/from Stand Sit to Stand: Min assist         General transfer comment: Min A for first stand and Min guard for further standing bouts. patient stood x 3 bouts total. verbal cues for forward weight shift as patient has tendency to lean back with transfer efforts. verbal cues for hand placement for safety  Ambulation/Gait             General Gait Details: did not progress ambulation as patient incontinent of urine with  standing trials   Stairs             Wheelchair Mobility    Modified Rankin (Stroke Patients Only)       Balance Overall balance assessment: Needs assistance Sitting-balance support: Feet supported Sitting balance-Leahy Scale: Fair Sitting balance - Comments: posterior lean requiring faciliation initially that improved with increased sitting time   Standing balance support: Bilateral upper extremity supported Standing balance-Leahy Scale: Poor Standing balance comment: right and posterior lean initiall, faciliation for mildine posture. improvements noted with further standing trials                            Cognition Arousal/Alertness: Awake/alert Behavior During Therapy: WFL for tasks assessed/performed Overall Cognitive Status: History of cognitive impairments - at baseline                                 General Comments: patient able to follow single step commands with extra time      Exercises General Exercises - Lower Extremity Long Arc Quad: Strengthening;AROM;Both;10 reps;Seated Hip ABduction/ADduction: Strengthening;AROM;Both;10 reps;Seated Straight Leg Raises: AROM;Strengthening;Both;10 reps;Seated Hip Flexion/Marching: AROM;Strengthening;Both;10 reps;Seated Other Exercises Other Exercises: verbal and visual cues for exercsise technique    General Comments        Pertinent Vitals/Pain Pain Assessment: No/denies pain    Home Living  Prior Function            PT Goals (current goals can now be found in the care plan section) Acute Rehab PT Goals Patient Stated Goal: to return home PT Goal Formulation: With patient Time For Goal Achievement: 07/26/20 Progress towards PT goals: Progressing toward goals    Frequency    Min 2X/week      PT Plan Current plan remains appropriate    Co-evaluation              AM-PAC PT "6 Clicks" Mobility   Outcome Measure  Help needed  turning from your back to your side while in a flat bed without using bedrails?: A Lot Help needed moving from lying on your back to sitting on the side of a flat bed without using bedrails?: A Lot Help needed moving to and from a bed to a chair (including a wheelchair)?: A Lot Help needed standing up from a chair using your arms (e.g., wheelchair or bedside chair)?: A Lot Help needed to walk in hospital room?: A Lot Help needed climbing 3-5 steps with a railing? : A Lot 6 Click Score: 12    End of Session Equipment Utilized During Treatment: Gait belt Activity Tolerance: Patient tolerated treatment well Patient left: in chair;with call bell/phone within reach;with family/visitor present (son in the room) Nurse Communication: Mobility status PT Visit Diagnosis: Other abnormalities of gait and mobility (R26.89);Muscle weakness (generalized) (M62.81);Unsteadiness on feet (R26.81)     Time: 4627-0350 PT Time Calculation (min) (ACUTE ONLY): 42 min  Charges:  $Therapeutic Exercise: 8-22 mins $Therapeutic Activity: 23-37 mins                    Minna Merritts, PT, MPT    Randall Schneider 07/14/2020, 2:16 PM

## 2020-07-14 NOTE — TOC Transition Note (Signed)
Transition of Care [facility]) - CM/SW Discharge Note   Patient Details  Name: Randall Schneider MRN: [id number] Date of Birth: 05-29-1930  [facility]) CM/SW Contact:  Alberteen Sam, LCSW Phone Number: 07/14/2020, 10:33 AM   Clinical Narrative:     CSW notes patient will dc today, per wife plan is to go home and resume home health. Patient was previously open with [facility], will resume services with them for PT, OT and aide. Corene Cornea with [facility] notified of discharge today.  Final next level of care: Home w Home Health Services Barriers to Discharge: No Barriers Identified   Patient Goals and CMS Choice   CMS Medicare.gov Compare Post Acute Care list provided to:: Patient Choice offered to / list presented to : [facility]  Discharge Placement                       Discharge Plan and Services                          HH Arranged: PT,OT,Nurse's Aide [facility] Agency: [place] ([place]) Date [place]: 07/14/20 Time [place]: 1033 Representative spoke with at [place]: [place] ([place]) Interventions     Readmission Risk Interventions No flowsheet data found.

## 2020-07-14 NOTE — Discharge Instructions (Signed)

## 2020-07-16 LAB — CULTURE, BLOOD (SINGLE): Culture: NO GROWTH

## 2020-07-28 ENCOUNTER — Ambulatory Visit (INDEPENDENT_AMBULATORY_CARE_PROVIDER_SITE_OTHER): Payer: Medicare Other | Admitting: Urology

## 2020-07-28 ENCOUNTER — Other Ambulatory Visit: Payer: Self-pay

## 2020-07-28 ENCOUNTER — Encounter: Payer: Self-pay | Admitting: Urology

## 2020-07-28 VITALS — BP 90/59 | HR 91 | Ht 72.0 in | Wt 175.0 lb

## 2020-07-28 DIAGNOSIS — R339 Retention of urine, unspecified: Secondary | ICD-10-CM | POA: Diagnosis not present

## 2020-07-28 MED ORDER — LEVOFLOXACIN 500 MG PO TABS
500.0000 mg | ORAL_TABLET | Freq: Once | ORAL | Status: AC
  Administered 2020-07-28: 500 mg via ORAL

## 2020-07-29 ENCOUNTER — Encounter: Payer: Self-pay | Admitting: Urology

## 2020-07-29 LAB — URINALYSIS, COMPLETE
Bilirubin, UA: NEGATIVE
Glucose, UA: NEGATIVE
Ketones, UA: NEGATIVE
Nitrite, UA: POSITIVE — AB
Specific Gravity, UA: 1.015 (ref 1.005–1.030)
Urobilinogen, Ur: 0.2 mg/dL (ref 0.2–1.0)
pH, UA: 6 (ref 5.0–7.5)

## 2020-07-29 LAB — MICROSCOPIC EXAMINATION: WBC, UA: >30 /hpf — ABNORMAL HIGH (ref 0–5)

## 2020-07-29 NOTE — Progress Notes (Signed)
   07/29/20  CC:  Chief Complaint  Patient presents with  . Cysto    HPI: Refer to my prior note 06/13/2020.  Had recurrent retention requiring replacement of the Foley catheter and was seen 4/13 after traumatic removal.  Residual >591 cc and the catheter was replaced and removed on 07/05/2020 with a follow-up PVR 144 mL.  His wife states he appears to be doing well with stable mental status and no bothersome voiding symptoms  Blood pressure (!) 90/59, pulse 91, height 6' (1.829 m), weight 175 lb (79.4 kg). NED. A&Ox3.   No respiratory distress   Abd soft, NT, ND Normal phallus with bilateral descended testicles  Cystoscopy Procedure Note  Patient identification was confirmed, informed consent was obtained, and patient was prepped using Betadine solution.  Lidocaine jelly was administered per urethral meatus.     Pre-Procedure: - Inspection reveals a normal caliber urethral meatus.  Procedure: The flexible cystoscope was introduced without difficulty - No urethral strictures/lesions are present. - TUR changes with open bladder neck and distal adenoma present prostate  - Normal bladder neck - Bilateral ureteral orifices identified - Bladder mucosa no gross tumor or mucosal abnormalities though sediment present making visualization suboptimal - No bladder stones -Moderate trabeculation  Retroflexion shows no abnormalities   Post-Procedure: - Patient tolerated the procedure well  Assessment/ Plan:  No evidence urethral stricture/bladder neck contracture  Retention/elevated residuals most likely related to bladder hypotonicity  Follow-up 3 months with bladder scan and instructed to call earlier for any voiding problems  Patient received Levaquin post procedure and urine was sent for culture   Abbie Sons, MD

## 2020-08-01 LAB — CULTURE, URINE COMPREHENSIVE

## 2020-08-02 ENCOUNTER — Telehealth: Payer: Self-pay | Admitting: *Deleted

## 2020-08-02 NOTE — Telephone Encounter (Signed)
Notified patient as instructed, patient is not having any urinary symptoms

## 2020-08-02 NOTE — Telephone Encounter (Signed)
-----   Message from Abbie Sons, MD sent at 08/02/2020  7:28 AM EDT ----- Urine culture was growing bacteria.  He most likely is chronically colonized and would not recommend treatment unless he is having definite UTI symptoms

## 2020-08-05 ENCOUNTER — Emergency Department
Admission: EM | Admit: 2020-08-05 | Discharge: 2020-08-05 | Disposition: A | Discharge status: Home / Self Care | Payer: Medicare Other | Attending: Emergency Medicine | Admitting: Emergency Medicine

## 2020-08-05 ENCOUNTER — Other Ambulatory Visit: Payer: Self-pay

## 2020-08-05 ENCOUNTER — Encounter: Payer: Self-pay | Admitting: Emergency Medicine

## 2020-08-05 DIAGNOSIS — R339 Retention of urine, unspecified: Secondary | ICD-10-CM | POA: Insufficient documentation

## 2020-08-05 DIAGNOSIS — Z8616 Personal history of COVID-19: Secondary | ICD-10-CM | POA: Insufficient documentation

## 2020-08-05 DIAGNOSIS — A042 Enteroinvasive Escherichia coli infection: Secondary | ICD-10-CM | POA: Insufficient documentation

## 2020-08-05 DIAGNOSIS — R350 Frequency of micturition: Secondary | ICD-10-CM | POA: Diagnosis present

## 2020-08-05 DIAGNOSIS — Z87891 Personal history of nicotine dependence: Secondary | ICD-10-CM | POA: Diagnosis not present

## 2020-08-05 DIAGNOSIS — N390 Urinary tract infection, site not specified: Secondary | ICD-10-CM | POA: Insufficient documentation

## 2020-08-05 DIAGNOSIS — I10 Essential (primary) hypertension: Secondary | ICD-10-CM | POA: Insufficient documentation

## 2020-08-05 DIAGNOSIS — Z79899 Other long term (current) drug therapy: Secondary | ICD-10-CM | POA: Diagnosis not present

## 2020-08-05 DIAGNOSIS — R338 Other retention of urine: Secondary | ICD-10-CM

## 2020-08-05 DIAGNOSIS — B9620 Unspecified Escherichia coli [E. coli] as the cause of diseases classified elsewhere: Secondary | ICD-10-CM

## 2020-08-05 DIAGNOSIS — Z7982 Long term (current) use of aspirin: Secondary | ICD-10-CM | POA: Insufficient documentation

## 2020-08-05 DIAGNOSIS — F0390 Unspecified dementia without behavioral disturbance: Secondary | ICD-10-CM | POA: Insufficient documentation

## 2020-08-05 LAB — COMPREHENSIVE METABOLIC PANEL
ALT: 22 U/L (ref 0–44)
AST: 20 U/L (ref 15–41)
Albumin: 3.1 g/dL — ABNORMAL LOW (ref 3.5–5.0)
Alkaline Phosphatase: 101 U/L (ref 38–126)
Anion gap: 8 (ref 5–15)
BUN: 19 mg/dL (ref 8–23)
CO2: 23 mmol/L (ref 22–32)
Calcium: 8.4 mg/dL — ABNORMAL LOW (ref 8.9–10.3)
Chloride: 104 mmol/L (ref 98–111)
Creatinine, Ser: 0.93 mg/dL (ref 0.61–1.24)
GFR, Estimated: >60 mL/min (ref >60)
Glucose, Bld: 106 mg/dL — ABNORMAL HIGH (ref 70–99)
Potassium: 3.9 mmol/L (ref 3.5–5.1)
Sodium: 135 mmol/L (ref 135–145)
Total Bilirubin: 0.7 mg/dL (ref 0.3–1.2)
Total Protein: 6.6 g/dL (ref 6.5–8.1)

## 2020-08-05 LAB — URINALYSIS, COMPLETE (UACMP) WITH MICROSCOPIC
Bilirubin Urine: NEGATIVE
Glucose, UA: NEGATIVE mg/dL
Hgb urine dipstick: NEGATIVE
Ketones, ur: NEGATIVE mg/dL
Nitrite: POSITIVE — AB
Protein, ur: 30 mg/dL — AB
Specific Gravity, Urine: 1.012 (ref 1.005–1.030)
Squamous Epithelial / HPF: NONE SEEN (ref 0–5)
WBC, UA: >50 WBC/hpf — ABNORMAL HIGH (ref 0–5)
pH: 6 (ref 5.0–8.0)

## 2020-08-05 LAB — CBC
HCT: 37.1 % — ABNORMAL LOW (ref 39.0–52.0)
Hemoglobin: 12.6 g/dL — ABNORMAL LOW (ref 13.0–17.0)
MCH: 30.1 pg (ref 26.0–34.0)
MCHC: 34 g/dL (ref 30.0–36.0)
MCV: 88.5 fL (ref 80.0–100.0)
Platelets: 151 10*3/uL (ref 150–400)
RBC: 4.19 MIL/uL — ABNORMAL LOW (ref 4.22–5.81)
RDW: 13.7 % (ref 11.5–15.5)
WBC: 6.8 10*3/uL (ref 4.0–10.5)
nRBC: 0 % (ref 0.0–0.2)

## 2020-08-05 MED ORDER — SODIUM CHLORIDE 0.9 % IV SOLN
1.0000 g | Freq: Once | INTRAVENOUS | Status: AC
  Administered 2020-08-05: 1 g via INTRAVENOUS
  Filled 2020-08-05: qty 10

## 2020-08-05 MED ORDER — CEPHALEXIN 500 MG PO CAPS: 500.0000 mg | ORAL_CAPSULE | Freq: Three times a day (TID) | ORAL | 0 refills | Status: AC

## 2020-08-05 NOTE — Discharge Instructions (Addendum)
You are being treated for a urinary tract infection and urinary retention.  Treatment is based off of your previous culture and a worsening of your symptoms.  You have been given initial IV dose in the ED.  Continue antibiotic by mouth as directed until all doses are gone.  Follow-up with Dr. Bernardo Heater in the next 1 to 2 weeks for reevaluation and possible Foley removal.  Return to the ED if needed.

## 2020-08-05 NOTE — ED Triage Notes (Signed)
Pt reports feels like he has a hard time when he tries to urinate and has some pain in his pubic area. Pt reports concerned he may have a UTI

## 2020-08-05 NOTE — ED Triage Notes (Addendum)
Pt comes into the ED via [facility] from home c/o abdominal pain.  Pt states he is having urinary retention and hasn't been able to urinate. VSS with EMS.  Pt was recently diagnosed with a UTI but never started on medication.

## 2020-08-05 NOTE — ED Notes (Signed)
Drainage bag changed to leg bag and secured. Wife given large drainage bag for nighttime at home. Wife instructed on changing bag and foley care. Wife verbalized understanding. Pt assisted into wheelchair by this RN and Presenter, broadcasting. Pt then assisted into car.

## 2020-08-05 NOTE — ED Provider Notes (Signed)
[facility] Emergency Department Provider Note  ____________________________________________   Event Date/Time   First MD Initiated Contact with Patient 08/05/20 1757     (approximate)  I have reviewed the triage vital signs and the nursing notes.   HISTORY  Chief Complaint Urinary Frequency and Abdominal Pain  HPI Randall Schneider is a 87 y.o. male with the below medial history, presents himself to the ED via EMS from home.  Patient's primary complaint is abdominal pain as well as urinary retention.  Patient is currently followed by [facility], and had a urine culture performed on 5/24 which colonized for E. coli.  It was thought that he was chronically colonized, and unless he was having acute UTI symptoms, decision was made to not treat him empirically.     Past Medical History:  Diagnosis Date  . Arthritis   . HOH (hard of hearing)   . Hypercholesteremia   . Hypertension     Patient Active Problem List   Diagnosis Date Noted  . Pneumonia due to COVID-19 virus 07/11/2020  . Acute respiratory failure due to COVID-19 ([place]) 07/11/2020  . Weakness 06/15/2020  . Essential hypertension 06/15/2020  . Dementia ([place]) 06/15/2020  . Acute lower UTI 06/15/2020  . Hyperlipidemia 06/15/2020    Past Surgical History:  Procedure Laterality Date  . CATARACT EXTRACTION W/PHACO Right 08/06/2018   Procedure: CATARACT EXTRACTION PHACO AND INTRAOCULAR LENS PLACEMENT ([place]) RIGHT DIABETES;  Surgeon: Leandrew Koyanagi, MD;  Location: [place];  Service: Ophthalmology;  Laterality: Right;  . CIRCUMCISION, NON-NEWBORN  05/07/2013  . HERNIA REPAIR    . TONSILLECTOMY     and adenoids  . TRANSURETHRAL RESECTION OF PROSTATE  05/07/2013    Prior to Admission medications   Medication Sig Start Date End Date Taking? Authorizing Provider  cephALEXin (KEFLEX) 500 MG capsule Take 1 capsule (500 mg total) by mouth 3 (three) times daily for 7 days. 08/05/20  08/12/20 Yes Merlinda Wrubel, Dannielle Karvonen, PA-C  albuterol (VENTOLIN HFA) 108 (90 Base) MCG/ACT inhaler Inhale 2 puffs into the lungs every 6 (six) hours. 07/14/20   Sharen Hones, MD  aspirin 81 MG tablet Take 81 mg by mouth daily.    [provider]  atorvastatin (LIPITOR) 10 MG tablet Take 10 mg by mouth daily.    [provider]  donepezil (ARICEPT) 10 MG tablet Take 10 mg by mouth daily.    [provider]  doxazosin (CARDURA) 2 MG tablet Take 2 mg by mouth at bedtime. 03/28/20   [provider]  FLUoxetine (PROZAC) 10 MG tablet Take 10 mg by mouth daily.    [provider]    Allergies Sulfa antibiotics  Family History  Family history unknown: Yes    Social History Social History   Tobacco Use  . Smoking status: Former Smoker    Quit date: 1990    Years since quitting: 32.4  . Smokeless tobacco: Never Used  Vaping Use  . Vaping Use: Never used  Substance Use Topics  . Alcohol use: Yes    Comment: rare  . Drug use: Never    Review of Systems  Constitutional: No fever/chills Eyes: No visual changes. ENT: No sore throat. Cardiovascular: Denies chest pain. Respiratory: Denies shortness of breath. Gastrointestinal: No abdominal pain.  No nausea, no vomiting.  No diarrhea.  No constipation. Genitourinary: Negative for dysuria. Reports retention and suprapubic pain Musculoskeletal: Negative for back pain. Skin: Negative for rash. Neurological: Negative for headaches, focal weakness  or numbness. ____________________________________________   PHYSICAL EXAM:  VITAL SIGNS: ED Triage Vitals  Enc Vitals Group     BP 08/05/20 1607 (!) 153/84     Pulse Rate 08/05/20 1607 (!) 102     Resp 08/05/20 1607 19     Temp 08/05/20 1607 98.5 F (36.9 C)     Temp Source 08/05/20 1607 Tympanic     SpO2 08/05/20 1607 97 %     Weight 08/05/20 1606 180 lb (81.6 kg)     Height 08/05/20 1606 5\' 5"  (1.651 m)     Head Circumference --      Peak Flow  --      Pain Score 08/05/20 1606 0     Pain Loc --      Pain Edu? --      Excl. in [place]? --    Constitutional: Alert and oriented. Well appearing and in no acute distress. Eyes: Conjunctivae are normal. PERRL. EOMI. Head: Atraumatic. Cardiovascular: Normal rate, regular rhythm. Grossly normal heart sounds.  Good peripheral circulation. Respiratory: Normal respiratory effort.  No retractions. Lungs CTAB. Gastrointestinal: Soft and nontender. No distention. No abdominal bruits. No CVA tenderness. Genitourinary: suprapubic tenderness. Exam otherwise deferrred Musculoskeletal: No lower extremity tenderness nor edema.  No joint effusions. Neurologic:  Normal speech and language. No gross focal neurologic deficits are appreciated. No gait instability. Skin:  Skin is warm, dry and intact. No rash noted. Psychiatric: Mood and affect are normal. Speech and behavior are normal. ____________________________________________   LABS (all labs ordered are listed, but only abnormal results are displayed)  Labs Reviewed  COMPREHENSIVE METABOLIC PANEL - Abnormal; Notable for the following components:      Result Value   Glucose, Bld 106 (*)    Calcium 8.4 (*)    Albumin 3.1 (*)    All other components within normal limits  CBC - Abnormal; Notable for the following components:   RBC 4.19 (*)    Hemoglobin 12.6 (*)    HCT 37.1 (*)    All other components within normal limits  URINALYSIS, COMPLETE (UACMP) WITH MICROSCOPIC - Abnormal; Notable for the following components:   Color, Urine YELLOW (*)    APPearance TURBID (*)    Protein, ur 30 (*)    Nitrite POSITIVE (*)    Leukocytes,Ua LARGE (*)    WBC, UA >50 (*)    Bacteria, UA FEW (*)    All other components within normal limits  URINE CULTURE   ____________________________________________  EKG   ____________________________________________  RADIOLOGY I, Melvenia Needles, personally viewed and evaluated these images (plain  radiographs) as part of my medical decision making, as well as reviewing the written report by the radiologist.  ED MD interpretation:    Official radiology report(s): No results found.  ____________________________________________   PROCEDURES  Procedure(s) performed (including Critical Care):  Procedures  Bladder scan 575 ml  Ceftriaxone 1 g IVP ____________________________________________   INITIAL IMPRESSION / ASSESSMENT AND PLAN / ED COURSE  As part of my medical decision making, I reviewed the following data within the [place] History obtained from family, Labs reviewed as noted, Old chart reviewed and Notes from prior ED visits   DDX: UTI, BPH, urinary retention, kidney stones  Geriatric patient with a history of BPH, presents to the ED for urinary retention and suprapubic tenderness, with onset this morning.  He had a recent urine culture after follow-up with urology, that colonized E. coli.  Patient was not empirically started  on antibiotics, given that he did not endorse acute UTI symptoms at that time.  Patient presents stable condition, he is afebrile without signs of acute infectious process.  Labs are reassuring at this time.  Bladder scan did reveal a full bladder at 575 mL.  Foley catheter is placed, and patient is comfortable at this time.  Urine culture is pending at this time.  Patient will be started empirically on Keflex after initial IV dose of ceftriaxone.  He is again referred to urology for further evaluation management of his symptoms.  ____________________________________________   FINAL CLINICAL IMPRESSION(S) / ED DIAGNOSES  Final diagnoses:  Acute urinary retention  E. coli UTI     ED Discharge Orders         Ordered    cephALEXin (KEFLEX) 500 MG capsule  3 times daily        08/05/20 1939          *Please note:  Randall Schneider was evaluated in Emergency Department on 08/05/2020 for the symptoms described in the history  of present illness. He was evaluated in the context of the global COVID-19 pandemic, which necessitated consideration that the patient might be at risk for infection with the SARS-CoV-2 virus that causes COVID-19. Institutional protocols and algorithms that pertain to the evaluation of patients at risk for COVID-19 are in a state of rapid change based on information released by regulatory bodies including the CDC and federal and state organizations. These policies and algorithms were followed during the patient's care in the ED.  Some ED evaluations and interventions may be delayed as a result of limited staffing during and the pandemic.*   Note:  This document was prepared using Dragon voice recognition software and may include unintentional dictation errors.    Melvenia Needles, PA-C 08/05/20 Newman Pies, MD 08/06/20 Berniece Salines

## 2020-08-05 NOTE — ED Notes (Signed)
Bladder scanner measures 559ml in bladder

## 2020-08-08 LAB — URINE CULTURE
Culture: >=100000 — AB
Special Requests: NORMAL

## 2020-08-17 ENCOUNTER — Encounter: Payer: Self-pay | Admitting: Urology

## 2020-08-17 ENCOUNTER — Ambulatory Visit: Payer: Medicare Other | Admitting: Urology

## 2020-08-17 ENCOUNTER — Ambulatory Visit (INDEPENDENT_AMBULATORY_CARE_PROVIDER_SITE_OTHER): Payer: Medicare Other | Admitting: Urology

## 2020-08-17 ENCOUNTER — Other Ambulatory Visit: Payer: Self-pay

## 2020-08-17 VITALS — BP 93/67 | HR 75 | Ht 68.0 in | Wt 185.0 lb

## 2020-08-17 DIAGNOSIS — R339 Retention of urine, unspecified: Secondary | ICD-10-CM

## 2020-08-17 LAB — BLADDER SCAN AMB NON-IMAGING

## 2020-08-17 NOTE — Progress Notes (Signed)
Catheter Removal  Patient is present today for a catheter removal.  6ml of water was drained from the balloon. A 18coude foley cath was removed from the bladder no complications were noted . Patient tolerated well.  Performed by: Elberta Leatherwood, CMA  Follow up/ Additional notes: PM for PVR

## 2020-08-17 NOTE — Progress Notes (Signed)
Patient returns with his wife this afternoon for PVR.  PVR was found to be 128 mL.  I explained to the patient and his wife that we do not need to place a catheter at this time as this is an acceptable residual.  I did advise that if he should difficulty urinating to have him return to the office or seek treatment in the emergency room for another catheter placement.  His wife also stated that they had an argument prior to coming to the office this afternoon and he can be quite verbally abusive at times with these arguments.  I offered her further help with eldercare services and she declined at this time.

## 2020-08-17 NOTE — Progress Notes (Signed)
08/17/2020 9:49 AM   Randall Schneider May 13, 1930 [id number]  Referring provider: Sofie Hartigan, [place],  [place] [address]  Chief Complaint  Patient presents with   Urinary Retention    HPI: 87 y.o. male with history of recurrent urinary retention.  Cystoscopy 07/28/2020 with TUR changes and open bladder neck without significant prostate tissue present Foley catheter was not replaced at that visit however he presented to the ED 5/27 complaining of recurrent retention.  Bladder scan showed volume 575 mL and Foley catheter was replaced   PMH: Past Medical History:  Diagnosis Date   Arthritis    HOH (hard of hearing)    Hypercholesteremia    Hypertension     Surgical History: Past Surgical History:  Procedure Laterality Date   CATARACT EXTRACTION W/PHACO Right 08/06/2018   Procedure: CATARACT EXTRACTION PHACO AND INTRAOCULAR LENS PLACEMENT ([place]) RIGHT DIABETES;  Surgeon: Leandrew Koyanagi, MD;  Location: [place];  Service: Ophthalmology;  Laterality: Right;   CIRCUMCISION, NON-NEWBORN  05/07/2013   HERNIA REPAIR     TONSILLECTOMY     and adenoids   TRANSURETHRAL RESECTION OF PROSTATE  05/07/2013    Home Medications:  Allergies as of 08/17/2020       Reactions   Sulfa Antibiotics    Unknown reaction "long time ago" Other reaction(s): Unknown Other reaction(s): UNKNOWN Unknown, happened when he was a child        Medication List        Accurate as of August 17, 2020  9:49 AM. If you have any questions, ask your nurse or doctor.          albuterol 108 (90 Base) MCG/ACT inhaler Commonly known as: VENTOLIN HFA Inhale 2 puffs into the lungs every 6 (six) hours.   aspirin 81 MG tablet Take 81 mg by mouth daily.   atorvastatin 10 MG tablet Commonly known as: LIPITOR Take 10 mg by mouth daily.   donepezil 10 MG tablet Commonly known as: ARICEPT Take 10 mg by mouth daily.   doxazosin 2 MG tablet Commonly known as:  CARDURA Take 2 mg by mouth at bedtime.   FLUoxetine 10 MG tablet Commonly known as: PROZAC Take 10 mg by mouth daily.        Allergies:  Allergies  Allergen Reactions   Sulfa Antibiotics     Unknown reaction "long time ago" Other reaction(s): Unknown Other reaction(s): UNKNOWN Unknown, happened when he was a child     Family History: Family History  Family history unknown: Yes    Social History:  reports that he quit smoking about 32 years ago. He has never used smokeless tobacco. He reports current alcohol use. He reports that he does not use drugs.   Physical Exam: BP 93/67   Pulse 75   Ht 5\' 8"  (1.727 m)   Wt 185 lb (83.9 kg)   BMI 28.13 kg/m   Constitutional:  Alert, No acute distress. HEENT: [place] AT, moist mucus membranes.  Trachea midline, no masses. Cardiovascular: No clubbing, cyanosis, or edema. Respiratory: Normal respiratory effort, no increased work of breathing.   Assessment & Plan:    1.  Recurrent urinary retention Recent cystoscopy with open prostatic fossa I again discussed possibility of retention secondary to bladder hypotonicity and in that event he would require either a chronic indwelling tube or intermittent catheterization.  He does have dementia and would be unable to perform CIC.  I did discuss possibility of teaching his wife  CIC versus SP tube placement She would like 1 more voiding trial and the catheter was removed and he will follow-up this afternoon for repeat bladder scan    Abbie Sons, MD  [facility] [address] [place], [place] [address] [phone]

## 2020-09-05 ENCOUNTER — Telehealth: Payer: Self-pay | Admitting: Primary Care

## 2020-09-05 NOTE — Telephone Encounter (Signed)
Attempted to contact patient to offer to schedule a Palliative Consult, no answer - left message with reason for call along with my name and call back number requesting a return call to schedule visit and to answer any questions regarding Palliative services.

## 2020-09-06 ENCOUNTER — Other Ambulatory Visit: Payer: Self-pay | Admitting: Primary Care

## 2020-09-06 ENCOUNTER — Telehealth: Payer: Self-pay | Admitting: Primary Care

## 2020-09-06 ENCOUNTER — Other Ambulatory Visit: Payer: Self-pay

## 2020-09-06 NOTE — Telephone Encounter (Signed)
Called and spoke with wife to let her know that the Palliative NP said that she would not be able to see patient today @ 2:30 PM and asked her if we could reschedule the Consult for 09/08/20 @ 9 AM and she was in agreement with this.  Notify NP.

## 2020-09-06 NOTE — Telephone Encounter (Signed)
Rec'd return call from patient's wife, [name], regarding the Palliative referral and after discussing the Palliative services with her and all questions were answered she was in agreement with beginning services with [place].  I have scheduled an In-home Consult for 09/06/20 @ 2:30 PM.

## 2020-09-08 ENCOUNTER — Other Ambulatory Visit: Payer: Self-pay

## 2020-09-08 ENCOUNTER — Other Ambulatory Visit: Payer: Medicare Other | Admitting: Primary Care

## 2020-09-08 DIAGNOSIS — R531 Weakness: Secondary | ICD-10-CM

## 2020-09-08 DIAGNOSIS — Z515 Encounter for palliative care: Secondary | ICD-10-CM

## 2020-09-08 DIAGNOSIS — F0391 Unspecified dementia with behavioral disturbance: Secondary | ICD-10-CM

## 2020-09-08 NOTE — Progress Notes (Signed)
[facility] Palliative Care Consult Note Telephone: [phone]  Fax: [phone]   Date of encounter: 09/08/20 PATIENT NAME: Randall Schneider [address] [place] [address]   [phone] (home)  DOB: 07/14/1930 MRN: [id number] PRIMARY CARE PROVIDER:    Sofie Hartigan, MD,  [place] [address] [phone]  REFERRING PROVIDER:   Sofie Hartigan, MD [place],  [place] [address] [phone]  RESPONSIBLE PARTY:    Contact Information     Name Relation Home Work Mobile   [facility] Jerilynn Mages Spouse [phone]  [phone]   Bonney Leitz   [phone]        I met face to face with patient and family in  home. Palliative Care was asked to follow this patient by consultation request of  Feldpausch, Chrissie Noa, MD to address advance care planning and complex medical decision making. This is the initial visit.                                     ASSESSMENT AND PLAN / RECOMMENDATIONS:   Advance Care Planning/Goals of Care: Goals include to maximize quality of life and symptom management. Our advance care planning conversation included a discussion about:    The value and importance of advance care planning  Experiences with loved ones who have been seriously ill or have died  Exploration of personal, cultural or spiritual beliefs that might influence medical decisions  Exploration of goals of care in the event of a sudden injury or illness  Identification of a healthcare agent - wife Review of an  advance directive document . CODE STATUS: FULL Discussed with wife RE ACP. She states they have wills and maybe living wills. She will meet with atty again soon about financial planning. She would like to review [employer] before deciding. I left her with form and the [employer] work book. There are adult children also to consult. Wife outlines difficulty of being in this situation and having to make these type  decisions.  Symptom Management/Plan: Caregiver strain: I met Randall and Mrs Schneider today in their home. Randall Schneider is largely bedbound, only able to be up with PT to ambulate short distances, and not able to go far without w/c support. Mrs Middleton does not feel safe to try to ambulate him alone. He's had several falls and she has to call [facility]. This is demoralizing to him and she is distressed when this happens. She is tearful discussing the 24/7 caregiver responsibility, finances to hire helpers ( she does have limited) and difficulty with depending on other family who are not always available. Gave caregiver resources such as online support groups or speakers such as [employer]. Also Discussed:  Psychosocial interventions for the management of behavioral and psychotic symptoms in patients with dementia - Routine activity. - Separate the person from what seems to be upsetting him or her. - Assess for the presence of pain, constipation, or other physical problem. - Review medications, especially new medications. - Don't disagree; respect the person's thoughts even if incorrect - Redirect the person to participate in an enjoyable activity or offer comfort food he or she may recognize and like. - If you appear to be the cause of the problem, leave the room for a while. - Avoid asking the person to do what appears to trigger an agitated or aggressive response.  Discussed  other venues for care such as facility care. She has financial concerns and will speak to an attorney. I also suggested Museum/gallery curator may have resources.  I met with patient. He was able to talk and laugh a bit. He states he is doing well and eats well. Wife reports some resistance to her care, and we discussed directing actions not asking for him to decide. He also works better with outside caregivers, which she knows is not uncommon.  Follow up Palliative Care Visit: Palliative care will continue to follow for complex medical decision  making, advance care planning, and clarification of goals. Return 6 weeks or prn.  I spent 75 minutes providing this consultation. More than 50% of the time in this consultation was spent in counseling and care coordination.  PPS: 30%  HOSPICE ELIGIBILITY/DIAGNOSIS: TBD  Chief Complaint: dementia with behavior disturbances  HISTORY OF PRESENT ILLNESS:  DYSHON PHILBIN is a 87 y.o. year old male  with dementia with behavior disturbances, recent covid infection .   History obtained from review of EMR, discussion with primary team, and interview with family, facility staff/caregiver and/or Randall Schneider.  I reviewed available labs, medications, imaging, studies and related documents from the EMR.  Records reviewed and summarized above.   ROS   General: NAD EYES: denies vision changes ENMT: denies dysphagia Cardiovascular: denies chest pain, denies DOE Pulmonary: denies cough, denies increased SOB Abdomen: endorses good appetite, denies constipation, endorses incontinence of bowel GU: denies dysuria, endorses incontinence of urine MSK:  endorses weakness,  numerous falls reported Skin: denies rashes or wounds Neurological: denies pain, denies insomnia Psych: Endorses positive mood Heme/lymph/immuno: denies bruises, abnormal bleeding  Physical Exam: Current and past weights:Stable 180 lb reported Constitutional: NAD General: frail appearing, tWNWD EYES: anicteric sclera, lids intact, no discharge  ENMT: intact hearing, oral mucous membranes moist CV: S1S2, RRR, no LE edema Pulmonary: LCTA, no increased work of breathing, no cough, room air Abdomen: intake 100%, normo-active BS + 4 quadrants, soft and non tender, no ascites GU: deferred MSK: ++ sarcopenia, moves all extremities,  non ambulatory Skin: warm and dry, no rashes or wounds on visible skin Neuro:  ++ generalized weakness,  severe  cognitive impairment Psych: non-anxious affect, A and O x 1-2 Hem/lymph/immuno: no widespread  bruising CURRENT PROBLEM LIST:  Patient Active Problem List   Diagnosis Date Noted   Pneumonia due to COVID-19 virus 07/11/2020   Acute respiratory failure due to COVID-19 ([place]) 07/11/2020   Weakness 06/15/2020   Essential hypertension 06/15/2020   Dementia ([place]) 06/15/2020   Acute lower UTI 06/15/2020   Hyperlipidemia 06/15/2020   PAST MEDICAL HISTORY:  Active Ambulatory Problems    Diagnosis Date Noted   Weakness 06/15/2020   Essential hypertension 06/15/2020   Dementia ([place]) 06/15/2020   Acute lower UTI 06/15/2020   Hyperlipidemia 06/15/2020   Pneumonia due to COVID-19 virus 07/11/2020   Acute respiratory failure due to COVID-19 [facility]) 07/11/2020   Resolved Ambulatory Problems    Diagnosis Date Noted   No Resolved Ambulatory Problems   Past Medical History:  Diagnosis Date   Arthritis    HOH (hard of hearing)    Hypercholesteremia    Hypertension    SOCIAL HX:  Social History   Tobacco Use   Smoking status: Former    Pack years: 0.00    Types: Cigarettes    Quit date: 1990    Years since quitting: 32.5   Smokeless tobacco: Never  Substance Use Topics   Alcohol  use: Yes    Comment: rare   Family history:  Family History  Family history unknown: Yes    ALLERGIES:  Allergies  Allergen Reactions   Sulfa Antibiotics     Unknown reaction "long time ago" Other reaction(s): Unknown Other reaction(s): UNKNOWN Unknown, happened when he was a child      PERTINENT MEDICATIONS:  Outpatient Encounter Medications as of 09/08/2020  Medication Sig   albuterol (VENTOLIN HFA) 108 (90 Base) MCG/ACT inhaler Inhale 2 puffs into the lungs every 6 (six) hours.   aspirin 81 MG tablet Take 81 mg by mouth daily.   atorvastatin (LIPITOR) 10 MG tablet Take 10 mg by mouth daily.   donepezil (ARICEPT) 10 MG tablet Take 10 mg by mouth daily.   doxazosin (CARDURA) 2 MG tablet Take 2 mg by mouth at bedtime.   FLUoxetine (PROZAC) 10 MG tablet Take 10 mg by mouth daily.   No  facility-administered encounter medications on file as of 09/08/2020.   Thank you for the opportunity to participate in the care of Randall. Schneider.  The palliative care team will continue to follow. Please call our office at [phone] if we can be of additional assistance.  Jason Coop, NP , AGPCNP- BC, ACHPN  COVID-19 PATIENT SCREENING TOOL Asked and negative response unless otherwise noted:  Have you had symptoms of covid, tested positive or been in contact with someone with symptoms/positive test in the past 5-10 days?

## 2020-10-11 ENCOUNTER — Telehealth: Payer: Self-pay

## 2020-10-11 ENCOUNTER — Other Ambulatory Visit: Payer: Medicare Other | Admitting: Primary Care

## 2020-10-11 ENCOUNTER — Other Ambulatory Visit: Payer: Self-pay

## 2020-10-11 DIAGNOSIS — Z515 Encounter for palliative care: Secondary | ICD-10-CM

## 2020-10-11 DIAGNOSIS — R531 Weakness: Secondary | ICD-10-CM

## 2020-10-11 DIAGNOSIS — M79602 Pain in left arm: Secondary | ICD-10-CM | POA: Insufficient documentation

## 2020-10-11 DIAGNOSIS — F0391 Unspecified dementia with behavioral disturbance: Secondary | ICD-10-CM

## 2020-10-11 NOTE — Progress Notes (Signed)
[facility] Consult Note Telephone: [phone]  Fax: [phone]    Date of encounter: 10/11/20 PATIENT NAME: Randall Schneider [address] [place] [address]   [phone] (home)  DOB: 08-12-1930 MRN: [id number] PRIMARY CARE PROVIDER:    Sofie Hartigan, MD,  [place] [address] [phone]  REFERRING PROVIDER:   Sofie Hartigan, [place],  [place] [address] [phone]  RESPONSIBLE PARTY:    Contact Information     Name Relation Home Work Mobile   [facility] Jerilynn Mages Spouse [phone]  [phone]   Bonney Leitz   [phone]       I met face to face with patient and wife in  home. Palliative Care was asked to follow this patient by consultation request of  Feldpausch, Chrissie Noa, MD to address advance care planning and complex medical decision making. This is a follow up visit.                                   ASSESSMENT AND PLAN / RECOMMENDATIONS:   Advance Care Planning/Goals of Care: Goals include to maximize quality of life and symptom management. Our advance care planning conversation included a discussion about:    The value and importance of advance care planning  Exploration of personal, cultural or spiritual beliefs that might influence medical decisions  Exploration of goals of care in the event of a sudden injury or illness  Identification  of a healthcare agent - wife Creation of an  advance directive document - DNR, most with comfort measures Decision not to resuscitate due to poor prognosis. CODE STATUS: DNR We reviewed advance care directives as these were left last time for her review. She has elected do not resuscitate with comfort measures. We discussed the progression of this disease as being long decline with plateaus. She outlined that her husband was asking to get off a train that was going to fast. We discussed  some of the things these analogies  could mean and encouraged him to encourage her to support. She's concerned about an upcoming surgery of hers that she will need round the clock care for sometime. I encouraged her to look into possibly having him go to a nursing home while she has her surgery and also doing any other advance care planning she needs to do for financial flexibility.   I spent 20 minutes providing this consultation. More than 50% of the time in this consultation was spent in counseling and care coordination.  ---------------------------------------------------------------------------------------------------------------  Symptom Management/Plan:  I met with wife and patient  in their home.   Mobility: Wife outlines how husband is continuing to decline, is not getting out of bed and is not progressing with PT. PT had gotten him up and left him sitting in the front room and she had to call 911 to return him to bed, as he was not able to pivot and she could not move him back into bed. He's not able to follow many commands, although he at other times can chat and interact with her. We discussed dementia and its characteristics of inconsistency while overall declining.   Yeast infection she outlines some pain and redness in his glans penis; he is not circumcised. Upon examination his foreskin was retracted. I asked her to return it to its position over the glans and encouraged her to  obtain over-the-counter antifungal  eg  clotrimazole and treat three times a day for several weeks. She had been using a petroleum based moisture barrier.   Primary care needs: She has discussed how he is not able to go out anymore and she is looking for an in-home primary provider. Their  current provider may be able to co-manage with me and I asked her to reach out to discuss that.  Left upper arm pain: endorses pain in his arm/shoulder. No gross changes but cannot perform ROM as he can in L. Pulses normal bil.  Area not inflamed or tender to  touch. Recommended otc analgesia to see if this helps, and asked wife to call if not improved. Acetaminophen Cr 650 mg po q 8 hrs. Prescribed.   Follow up Palliative Care Visit: Palliative care will continue to follow for complex medical decision making, advance care planning, and clarification of goals. Return 4 weeks or prn.  This visit was coded based on medical decision making (MDM).  PPS: 30%  HOSPICE ELIGIBILITY/DIAGNOSIS: TBD  Chief Complaint: dementia, arm pain, groin yeast  HISTORY OF PRESENT ILLNESS:  Randall Schneider is a 87 y.o. year old male  with  h/o dementia, and current c/o L upper arm pain and penis pain and redness.   Has had upper arm pain with movement and endorses 8/10 on painad when manipulated. Has not tried any otc analgesia. Penis is red and foreskin is retracted. Has been using petroleum based barrier ointment without improvement.  History obtained from review of EMR, discussion with primary team, and interview with family, facility staff/caregiver and/or Randall Schneider.  I reviewed available labs, medications, imaging, studies and related documents from the EMR.  Records reviewed and summarized above.   ROS   General: NAD EYES: denies vision changes ENMT: denies dysphagia, edentulous Cardiovascular: denies chest pain, denies DOE Pulmonary: denies cough, denies increased SOB Abdomen: endorses good appetite, denies constipation, endorses incontinence of bowel GU: denies dysuria, endorses incontinence of urine, endorses red penis and pain MSK:  endorses weakness,   loss of transfer ability, no falls reported Skin: red penis on glans Neurological:  endorses arm and penis  pain, denies insomnia Psych: Endorses positive mood Heme/lymph/immuno: denies bruises, abnormal bleeding  Physical Exam: Current and past weights: unavailable Constitutional: NAD General: frail appearing, thin  EYES: anicteric sclera, lids intact, no discharge  ENMT: intact hearing, oral mucous  membranes dry, edentulous CV: S1S2, RRR, no LE edema Pulmonary: LCTA, no increased work of breathing, no cough, room air Abdomen: intake 50-75%,  soft and non tender, no ascites GU: deferred MSK: severe sarcopenia, moves all extremities, non ambulatory Skin: warm and dry, no rashes or wounds on visible skin Neuro:   severe generalized weakness,  severe cognitive impairment Psych: non-anxious affect, A and O x 1 Hem/lymph/immuno: no widespread bruising  Thank you for the opportunity to participate in the care of Randall Schneider.  The palliative care team will continue to follow. Please call our office at [phone] if we can be of additional assistance.   Jason Coop, NP   COVID-19 PATIENT SCREENING TOOL Asked and negative response unless otherwise noted:   Have you had symptoms of covid, tested positive or been in contact with someone with symptoms/positive test in the past 5-10 days?

## 2020-10-11 NOTE — Telephone Encounter (Signed)
[place] updated on patient's need for [facility] lift.

## 2020-10-21 ENCOUNTER — Ambulatory Visit: Payer: Self-pay | Admitting: Urology

## 2020-10-28 ENCOUNTER — Ambulatory Visit: Payer: Self-pay | Admitting: Urology

## 2020-10-30 ENCOUNTER — Emergency Department
Admission: EM | Admit: 2020-10-30 | Discharge: 2020-10-30 | Disposition: A | Discharge status: Home / Self Care | Payer: Medicare Other | Attending: Emergency Medicine | Admitting: Emergency Medicine

## 2020-10-30 ENCOUNTER — Other Ambulatory Visit: Payer: Self-pay

## 2020-10-30 DIAGNOSIS — Z87891 Personal history of nicotine dependence: Secondary | ICD-10-CM | POA: Insufficient documentation

## 2020-10-30 DIAGNOSIS — I10 Essential (primary) hypertension: Secondary | ICD-10-CM | POA: Diagnosis not present

## 2020-10-30 DIAGNOSIS — F0390 Unspecified dementia without behavioral disturbance: Secondary | ICD-10-CM | POA: Insufficient documentation

## 2020-10-30 DIAGNOSIS — Z87440 Personal history of urinary (tract) infections: Secondary | ICD-10-CM | POA: Insufficient documentation

## 2020-10-30 DIAGNOSIS — Z79899 Other long term (current) drug therapy: Secondary | ICD-10-CM | POA: Insufficient documentation

## 2020-10-30 DIAGNOSIS — Z8616 Personal history of COVID-19: Secondary | ICD-10-CM | POA: Insufficient documentation

## 2020-10-30 DIAGNOSIS — Z7982 Long term (current) use of aspirin: Secondary | ICD-10-CM | POA: Diagnosis not present

## 2020-10-30 DIAGNOSIS — R1030 Lower abdominal pain, unspecified: Secondary | ICD-10-CM | POA: Insufficient documentation

## 2020-10-30 LAB — COMPREHENSIVE METABOLIC PANEL
ALT: 15 U/L (ref 0–44)
AST: 16 U/L (ref 15–41)
Albumin: 3.3 g/dL — ABNORMAL LOW (ref 3.5–5.0)
Alkaline Phosphatase: 90 U/L (ref 38–126)
Anion gap: 6 (ref 5–15)
BUN: 13 mg/dL (ref 8–23)
CO2: 28 mmol/L (ref 22–32)
Calcium: 9 mg/dL (ref 8.9–10.3)
Chloride: 106 mmol/L (ref 98–111)
Creatinine, Ser: 0.56 mg/dL — ABNORMAL LOW (ref 0.61–1.24)
GFR, Estimated: >60 mL/min (ref >60)
Glucose, Bld: 99 mg/dL (ref 70–99)
Potassium: 3.8 mmol/L (ref 3.5–5.1)
Sodium: 140 mmol/L (ref 135–145)
Total Bilirubin: 1.2 mg/dL (ref 0.3–1.2)
Total Protein: 6.6 g/dL (ref 6.5–8.1)

## 2020-10-30 LAB — URINALYSIS, COMPLETE (UACMP) WITH MICROSCOPIC
Bacteria, UA: NONE SEEN
Bilirubin Urine: NEGATIVE
Glucose, UA: NEGATIVE mg/dL
Hgb urine dipstick: NEGATIVE
Ketones, ur: NEGATIVE mg/dL
Leukocytes,Ua: NEGATIVE
Nitrite: NEGATIVE
Protein, ur: NEGATIVE mg/dL
Specific Gravity, Urine: 1.015 (ref 1.005–1.030)
pH: 5.5 (ref 5.0–8.0)

## 2020-10-30 LAB — CBC
HCT: 41.8 % (ref 39.0–52.0)
Hemoglobin: 13.8 g/dL (ref 13.0–17.0)
MCH: 28.8 pg (ref 26.0–34.0)
MCHC: 33 g/dL (ref 30.0–36.0)
MCV: 87.1 fL (ref 80.0–100.0)
Platelets: 175 10*3/uL (ref 150–400)
RBC: 4.8 MIL/uL (ref 4.22–5.81)
RDW: 14.3 % (ref 11.5–15.5)
WBC: 6.6 10*3/uL (ref 4.0–10.5)
nRBC: 0 % (ref 0.0–0.2)

## 2020-10-30 LAB — LIPASE, BLOOD: Lipase: 30 U/L (ref 11–51)

## 2020-10-30 MED ORDER — CEFDINIR 300 MG PO CAPS: 300.0000 mg | ORAL_CAPSULE | Freq: Two times a day (BID) | ORAL | 0 refills | Status: AC

## 2020-10-30 MED ORDER — QUETIAPINE FUMARATE 25 MG PO TABS: 25.0000 mg | ORAL_TABLET | Freq: Every day | ORAL | 0 refills | Status: DC

## 2020-10-30 NOTE — ED Notes (Signed)
Pt cleaned of urine. New brief and chux applied. Wife at bedside. Pt is alert, oriented to self and situation only. Pt c/o of generalized body pain w/ movement.

## 2020-10-30 NOTE — ED Provider Notes (Signed)
[facility] Emergency Department Provider Note   ____________________________________________   Event Date/Time   First MD Initiated Contact with Patient 10/30/20 1651     (approximate)  I have reviewed the triage vital signs and the nursing notes.   HISTORY  Chief Complaint Abdominal Pain    HPI Randall Schneider is a 87 y.o. male with a past medical history of dementia and hearing loss who presents with his wife who is his primary caretaker stating that patient has been complaining of of suprapubic abdominal pain that began today.  Patient states that he has been pointing toward his abdomen and complaining of pain despite urinating normally.  She denies any genital abnormalities or abnormal urethral discharge.  Patient does wear a diaper constantly and has history of frequent UTIs.  Any further history or review of systems are unable to be assessed at this time given patient's mental status          Past Medical History:  Diagnosis Date   Arthritis    HOH (hard of hearing)    Hypercholesteremia    Hypertension     Patient Active Problem List   Diagnosis Date Noted   Arm pain, lateral, left 10/11/2020   Pneumonia due to COVID-19 virus 07/11/2020   Acute respiratory failure due to COVID-19 [facility]) 07/11/2020   Weakness 06/15/2020   Essential hypertension 06/15/2020   Dementia ([place]) 06/15/2020   Acute lower UTI 06/15/2020   Hyperlipidemia 06/15/2020    Past Surgical History:  Procedure Laterality Date   CATARACT EXTRACTION W/PHACO Right 08/06/2018   Procedure: CATARACT EXTRACTION PHACO AND INTRAOCULAR LENS PLACEMENT ([employer]) RIGHT DIABETES;  Surgeon: Leandrew Koyanagi, MD;  Location: [facility];  Service: Ophthalmology;  Laterality: Right;   CIRCUMCISION, NON-NEWBORN  05/07/2013   HERNIA REPAIR     TONSILLECTOMY     and adenoids   TRANSURETHRAL RESECTION OF PROSTATE  05/07/2013    Prior to Admission medications   Medication Sig  Start Date End Date Taking? Authorizing Provider  cefdinir (OMNICEF) 300 MG capsule Take 1 capsule (300 mg total) by mouth 2 (two) times daily for 5 days. 10/30/20 11/04/20 Yes Naaman Plummer, MD  QUEtiapine (SEROQUEL) 25 MG tablet Take 1 tablet (25 mg total) by mouth at bedtime for 14 days. 10/30/20 11/13/20 Yes Naaman Plummer, MD  acetaminophen (TYLENOL 8 HOUR ARTHRITIS PAIN) 650 MG CR tablet Take 650 mg by mouth every 8 (eight) hours.    [provider]  albuterol (VENTOLIN HFA) 108 (90 Base) MCG/ACT inhaler Inhale 2 puffs into the lungs every 6 (six) hours. Patient not taking: Reported on 09/08/2020 07/14/20   Sharen Hones, MD  aspirin 81 MG tablet Take 81 mg by mouth daily.    [provider]  atorvastatin (LIPITOR) 10 MG tablet Take 10 mg by mouth daily.    [provider]  clotrimazole (ANTIFUNGAL CLOTRIMAZOLE) 1 % cream Apply 1 application topically 2 (two) times daily. To glans of penis    [provider]  donepezil (ARICEPT) 10 MG tablet Take 10 mg by mouth daily.    [provider]  doxazosin (CARDURA) 2 MG tablet Take 2 mg by mouth at bedtime. 03/28/20   [provider]  FLUoxetine (PROZAC) 10 MG tablet Take 10 mg by mouth daily.    [provider]  ibuprofen (ADVIL) 200 MG tablet Take 200 mg by mouth 2 (two) times daily as needed for moderate pain.    [provider]  Polyethyl Glycol-Propyl Glycol (SYSTANE) 0.4-0.3 % SOLN Apply 1 drop to eye in the morning, at noon, in the evening, and at bedtime. To both eyes    [provider]    Allergies Sulfa antibiotics  Family History  Family history unknown: Yes    Social History Social History   Tobacco Use   Smoking status: Former    Types: Cigarettes    Quit date: 1990    Years since quitting: 32.6   Smokeless tobacco: Never  Vaping Use   Vaping Use: Never used  Substance Use Topics   Alcohol use: Yes    Comment: rare   Drug use: Never    Review  of Systems Unable to assess ____________________________________________   PHYSICAL EXAM:  VITAL SIGNS: ED Triage Vitals  Enc Vitals Group     BP 10/30/20 1211 (!) 152/75     Pulse Rate 10/30/20 1211 62     Resp 10/30/20 1211 18     Temp 10/30/20 1211 98.4 F (36.9 C)     Temp Source 10/30/20 1211 Oral     SpO2 10/30/20 1211 95 %     Weight 10/30/20 1212 180 lb (81.6 kg)     Height 10/30/20 1212 '5\' 11"'$  (1.803 m)     Head Circumference --      Peak Flow --      Pain Score 10/30/20 1212 5     Pain Loc --      Pain Edu? --      Excl. in [place]? --    Constitutional: Alert and disoriented. Well appearing elderly Caucasian male in no acute distress. Eyes: Conjunctivae are normal. PERRL. Head: Atraumatic. Nose: No congestion/rhinnorhea. Mouth/Throat: Mucous membranes are moist. Neck: No stridor Cardiovascular: Grossly normal heart sounds.  Good peripheral circulation. Respiratory: Normal respiratory effort.  No retractions. Gastrointestinal: Soft and suprapubic tenderness to palpation. No distention. Musculoskeletal: No obvious deformities Neurologic:  Normal speech and language. No gross focal neurologic deficits are appreciated. Skin:  Skin is warm and dry. No rash noted. Psychiatric: Mood and affect are normal. Speech and behavior are normal.  ____________________________________________   LABS (all labs ordered are listed, but only abnormal results are displayed)  Labs Reviewed  COMPREHENSIVE METABOLIC PANEL - Abnormal; Notable for the following components:      Result Value   Creatinine, Ser 0.56 (*)    Albumin 3.3 (*)    All other components within normal limits  URINALYSIS, COMPLETE (UACMP) WITH MICROSCOPIC - Abnormal; Notable for the following components:   Color, Urine YELLOW (*)    APPearance CLEAR (*)    All other components within normal limits  LIPASE, BLOOD  CBC   PROCEDURES  Procedure(s) performed (including Critical Care):  .1-3 Lead EKG  Interpretation  Date/Time: 10/30/2020 10:54 PM Performed by: Naaman Plummer, MD Authorized by: Naaman Plummer, MD     Interpretation: normal     ECG rate:  66   ECG rate assessment: normal     Rhythm: sinus rhythm     Ectopy: none     Conduction: normal     ____________________________________________   INITIAL IMPRESSION / ASSESSMENT AND PLAN / ED COURSE  As part of my medical decision making, I reviewed the following data within the electronic medical record, if available:  Nursing notes reviewed and incorporated, Labs reviewed, EKG interpreted, Old chart reviewed, Radiograph reviewed and Notes from prior ED visits reviewed and incorporated     Patient presents for abdominal pain.  Differential  diagnosis includes appendicitis, abdominal aortic aneurysm, surgical biliary disease, pancreatitis, SBO, mesenteric ischemia, serious intra-abdominal bacterial illness, genital torsion. Doubt atypical ACS. Based on history, physical exam, radiologic/laboratory evaluation, there is no red flag results or symptomatology requiring emergent intervention or need for admission at this time Pt tolerating PO. Disposition: Patient will be discharged with strict return precautions and follow up with primary MD within 12-24 hours for further evaluation. Patient understands that this still may have an early presentation of an emergent medical condition such as appendicitis that will require a recheck.      ____________________________________________   FINAL CLINICAL IMPRESSION(S) / ED DIAGNOSES  Final diagnoses:  Lower abdominal pain     ED Discharge Orders          Ordered    cefdinir (OMNICEF) 300 MG capsule  2 times daily        10/30/20 1825    QUEtiapine (SEROQUEL) 25 MG tablet  Daily at bedtime        10/30/20 1830             Note:  This document was prepared using Dragon voice recognition software and may include unintentional dictation errors.    Naaman Plummer,  MD 10/30/20 [phone]

## 2020-10-30 NOTE — ED Triage Notes (Addendum)
First nurse note: Arrived by EMS from home. Bedbound X5 months. C/o abdominal pain with pain when palpated that started yesterday. EMS vitals 138/64 blood pressure, 109blood sugar, 60HR. 95% O2. Family reports patient is not as oriented as usual.

## 2020-10-30 NOTE — ED Notes (Signed)
Called EMS for transport back tohome

## 2020-10-30 NOTE — ED Notes (Signed)
Wife given education on hydration, nutrition, skin care and repositioning to prevent bed sores. Wife verbalizes understanding.

## 2020-10-30 NOTE — ED Triage Notes (Signed)
Pt via EMS from home. Pt states that he is have lower belly pain that started yesterday. Pt denies NVD. Pt is alert and oriented to only self. NAD

## 2020-10-30 NOTE — ED Notes (Signed)
Attempted to bring pt out to car in wheelchair w/ assistance of 2 nurses per wife's request. Pt very weak and unable to bear any weight. Pt lifted back into bed with assistance from four staff members. New brief applied. EDP notified- will arrange transportation home. Wife notified.

## 2020-10-31 ENCOUNTER — Telehealth: Payer: Self-pay

## 2020-10-31 NOTE — Telephone Encounter (Signed)
11 am.  Message received from [facility], SW requesting a call be made to Mrs. Wofe regarding the need for a home care provider and medication refills.    Call made to Mrs. Mesecher who states her husband is now bed-bound and she can no longer transport him to doctor's appointments.  Current PCP does not participate in virtual visits and would not be able to complete medication refills unless patient is seen in the office.  Advised Mrs. Rogers Blocker of [facility] and [facility] that both have providers who see patients in their homes.  Phone numbers are provided. Wife will contact these agencies to obtain more information and also check insurance coverage.  Wife advised to contact Palliative Care if neither of the services are able to accommodate their needs.

## 2020-11-10 ENCOUNTER — Other Ambulatory Visit: Payer: Self-pay

## 2020-11-10 ENCOUNTER — Other Ambulatory Visit: Payer: Medicare Other | Admitting: Primary Care

## 2020-11-10 DIAGNOSIS — Z515 Encounter for palliative care: Secondary | ICD-10-CM

## 2020-11-10 DIAGNOSIS — F0391 Unspecified dementia with behavioral disturbance: Secondary | ICD-10-CM

## 2020-11-10 DIAGNOSIS — N390 Urinary tract infection, site not specified: Secondary | ICD-10-CM

## 2020-11-10 DIAGNOSIS — M79602 Pain in left arm: Secondary | ICD-10-CM

## 2020-11-10 NOTE — Progress Notes (Signed)
[facility] Consult Note Telephone: [phone]  Fax: [phone]    Date of encounter: 11/10/20 9:38 AM PATIENT NAME: Randall Schneider [place] [address]   [phone] (home)  DOB: Oct 14, 1930 MRN: [id number] PRIMARY CARE PROVIDER:    Sofie Hartigan, MD,  [place] [facility] [place] [address] [phone]  REFERRING PROVIDER:   Sofie Hartigan, MD [place],  [place] [address] [phone]  RESPONSIBLE PARTY:    Contact Information     Name Relation Home Work Mobile   [facility] Jerilynn Mages Spouse [phone]  [phone]   Bonney Leitz   [phone]        I met face to face with patient and family in  home. Palliative Care was asked to follow this patient by consultation request of  Feldpausch, Chrissie Noa, MD to address advance care planning and complex medical decision making. This is a follow up visit.                                   ASSESSMENT AND PLAN / RECOMMENDATIONS:   Advance Care Planning/Goals of Care: Goals include to maximize quality of life and symptom management. Our advance care planning conversation included a discussion about:    The value and importance of advance care planning  Exploration of personal, cultural or spiritual beliefs that might influence medical decisions  Exploration of goals of care in the event of a sudden injury or illness  Review of an  advance directive document . Discussed hospice services and when this is appropriate. Wife tearful at discussion of EOL decline.   CODE STATUS: DNR, comfort measures I spent 16 minutes providing this consultation. More than 50% of the time in this consultation was spent in counseling and care coordination.  -----------------------------------------------------------------------------------------------------------  Symptom Management/Plan:  Intake: Hydrating well, 100 ml daily. Eating three meals a day.  Endorses dysphagia.  Mobility: Was able to walk into office  with walker  6 months ago for Neuro appt, was walking (I) a year ago.  Now is bed bound and only able to log roll. I will f/u with neuro to discuss PD and video if possible to assess any treatable decline. Appears to be exhibiting some parkinsonism sx.   Pain: Try 200 mg ibuprofen with tylenol for MSK pain. I will also send tramadol 50 mg #15  for qid prn, 25 - 50 mg prn pain.  Hallucinations: Had prescribed seroquel at hospital but did not use due to insurance refused payment. Will send for Good Rx to [facility]. Seroquel 25 mg po q hs  #30. Start with half dose at hs and titrate. Wife endorses he is sleeping more.  UTI: Has had 4 since April, 2022. Hallucinations seemed to be associated with UTIs. Not voiding all day often times. Discussed I and O caths. Will ask aide if she can do caths and will f/u with urology as needed. In 2 weeks we will discuss, and she will take urine for c/s to urology. Recent u/a at hospital reviewed, no culture available for review. Previous culture in 5/22 reviewed.  Follow up Palliative Care Visit: Palliative care will continue to follow for complex medical decision making, advance care planning, and clarification of goals. Return 2 weeks or prn.  This visit was coded based on medical decision making (MDM).  PPS: 30%  HOSPICE ELIGIBILITY/DIAGNOSIS: TBD  Chief Complaint: hallucinations  HISTORY OF PRESENT ILLNESS:  DAYSEN GUNDRUM is a 87 y.o. year old male  with dementia, Parkinsonism, chronic UTI, immobility, debility presents today with daily hallucinations x 4 months in context of ?PD and/or UTI . Seroquel was offered but not yet used, and no other modality attempted. Wife endorses he is awake frequently crying out in the night.   History obtained from review of EMR, discussion with primary team, and interview with family, facility staff/caregiver and/or Randall Schneider.  I reviewed available labs,  medications, imaging, studies and related documents from the EMR.  Records reviewed and summarized above.   ROS/family   General: NAD ENMT: endorses dysphagia Cardiovascular: denies chest pain, denies DOE Pulmonary: denies cough, denies increased SOB Abdomen: endorses good appetite, denies constipation, endorses incontinence of bowel GU: denies dysuria, endorses incontinence of urine,  poss. retention MSK:  endorses weakness,  endorses stiffness, no falls reported Skin: sacral PI reported Neurological: endorses pain, endorses  insomnia Psych: Endorses agitated mood, reports hallucinations  Heme/lymph/immuno: denies bruises, abnormal bleeding  Physical Exam: Current and past weights:180 lbs,  was 188 lbs in 4/22. Constitutional: NAD General: frail appearing, thin EYES: anicteric sclera, lids intact, no discharge  ENMT: intact hearing, oral mucous membranes moist, dentition intact CV: no LE edema Pulmonary: no increased work of breathing, no cough, room air Abdomen: intake 100%, no ascites GU: deferred MSK: ++sarcopenia, moves all extremities,  non ambulatory Skin: warm and dry, no rashes or wounds on visible skin Neuro:  ++generalized weakness,  severe  cognitive impairment Psych: non -anxious affect, A and O x 1-2 Hem/lymph/immuno: no widespread bruising  Thank you for the opportunity to participate in the care of Randall Schneider.  The palliative care team will continue to follow. Please call our office at [phone] if we can be of additional assistance.   Jason Coop, NP   COVID-19 PATIENT SCREENING TOOL Asked and negative response unless otherwise noted:   Have you had symptoms of covid, tested positive or been in contact with someone with symptoms/positive test in the past 5-10 days?

## 2020-11-24 ENCOUNTER — Other Ambulatory Visit: Payer: Self-pay

## 2020-11-24 ENCOUNTER — Other Ambulatory Visit: Payer: Medicare Other | Admitting: Primary Care

## 2020-11-24 DIAGNOSIS — F0391 Unspecified dementia with behavioral disturbance: Secondary | ICD-10-CM

## 2020-11-24 DIAGNOSIS — Z515 Encounter for palliative care: Secondary | ICD-10-CM

## 2020-11-24 NOTE — Progress Notes (Signed)
[facility] Consult Note Telephone: [phone]  Fax: [phone]    Date of encounter: 11/24/20 9:33 AM PATIENT NAME: Randall Schneider [address] [place] [address]   [phone] (home)  DOB: 11/23/1930 MRN: [id number] PRIMARY CARE PROVIDER:    Sofie Hartigan, MD,  [place] [address] [phone]  REFERRING PROVIDER:   Sofie Hartigan, MD [place],  [place] [address] [phone]  RESPONSIBLE PARTY:    Contact Information     Name Relation Home Work Mobile   [facility] Jerilynn Mages Spouse [phone]  [phone]   Bonney Leitz   [phone]       I met face to face with patient and family in home/facility. Palliative Care was asked to follow this patient by consultation request of  Feldpausch, Chrissie Noa, MD to address advance care planning and complex medical decision making. This is a follow up visit.                                   ASSESSMENT AND PLAN / RECOMMENDATIONS:   Advance Care Planning/Goals of Care: Goals include to maximize quality of life and symptom management. Our advance care planning conversation included a discussion about:    The value and importance of advance care planning  Exploration of personal, cultural or spiritual beliefs that might influence medical decisions  Exploration of goals of care in the event of a sudden injury or illness  Review of an advance directive document. Wife has and is in the process of reviewing MOST form for patient. CODE STATUS: FULL  Symptom Management/Plan:  Hallucinations: Patient began Seroquel 25 mg po q hs. Wife endorses his behaviors have improved but states he still has intermittent episodes of yelling out and agitation. She states he is sleeping more at night but still calls her all night asking her to get him to a train. Wife endorses extreme caregiver strain with many sleepless nights. Recommended increasing  Seroquel 25 mg to 2 tabs at hs and 1 tab at 1400 to reduce agitation. Seroquel 25 mg po at 1400 and 50 mg 2 (tabs) at qhs,  #90.  Sent to [facility].  Pain: Alternating ibuprofen 200 mg with tylenol for MSK pain. Also started taking tramadol  50 mg PRN (at least once a day basis). Wife states she waits to use tramadol for severe pain -usually when patient is in tears. Provided education to treate before tears, and Provided education to wife regarding early s/sx of pain ie: facial grimacing and restlessness. Recommended up to QID dosing of tramadol for adequate pain relief. I will also send tramadol 50 mg po # 24  with refill for qid prn pain, to [facility] [address]. PDMP checked.   Urinary Retention: Wife endorses voiding trials have been successful. She is offering to toilet patient several times through out the day. She reports adequate urine out put with each attempt. Wife has also increased fluids for patient and toilets patient before bedtime which seems to have improved his overall daily output. Has had 4 UTIs since April, 2022. Hallucinations  were thought to be associated with UTIs, which wife endorses have improved.    Follow up Palliative Care Visit: Palliative care will continue to follow for complex medical decision making, advance care planning, and clarification of goals. Return 4 weeks or prn.  I spent  40 minutes providing this consultation. More than 50% of the time in this consultation was spent in counseling and care coordination.  PPS: 30%  HOSPICE ELIGIBILITY/DIAGNOSIS: no  Chief Complaint: Hallucinations, insomnia  HISTORY OF PRESENT ILLNESS:  Randall Schneider is a 87 y.o. year old male  with dementia, Parkinsonism, chronic UTI, immobility, debility presents today for follow up of hallucinations x 4 months in context of ?PD and/or UTI . Seroquel and tramadol modalities were attempted since last visit. Wife endorses he is sleeping more frequently at night and that  his crying outbursts have lessened. Wife states she is offering frequent toileting to assist with bladder emptying and feels this has improved patients daily output. Overall, wife feels patients symptoms are improving however states there are still intermittent times of yelling and pain.   History obtained from review of EMR, discussion with primary team, and interview with family, facility staff/caregiver and/or Mr. Scrivens.  I reviewed available labs, medications, imaging, studies and related documents from the EMR.  Records reviewed and summarized above.   ROS  General: NAD EYES: denies vision changes ENMT: denies dysphagia Cardiovascular: denies chest pain, denies DOE Pulmonary: denies cough, denies increased SOB Abdomen: endorses good appetite, denies constipation, endorses  incontinence of bowel GU: denies dysuria, endorses  incontinence of urine MSK:  endorses generalized weakness,  no falls reported, wife reports area of non tender elbow swelling Skin: denies rashes or wounds Neurological: endorses pain, endorses insomnia, endorses hs hallucinations. Psych: Endorses positive mood Heme/lymph/immuno: denies bruises, abnormal bleeding  Physical Exam: Constitutional: NAD General: WNWD, frail appearing EYES: anicteric sclera, lids intact, no discharge  ENMT: intact hearing, oral mucous membranes moist, missing teeth CV: S1S2, RRR, no LE edema Pulmonary: LCTA, no increased work of breathing, no cough, room air Abdomen: intake 100%, normo-active BS + 4 quadrants, soft and non tender, no ascites GU: deferred MSK: mild sarcopenia, moves all extremities, non-ambulatory Skin: warm and dry, no rashes or wounds on visible skin Neuro:  + generalized weakness,  ++ cognitive impairment Psych: non-anxious affect, A and O x 1 Hem/lymph/immuno: no widespread bruising   Thank you for the opportunity to participate in the care of Mr. Eye.  The palliative care team will continue to follow.  Please call our office at [phone] if we can be of additional assistance.   Jason Coop, NP   COVID-19 PATIENT SCREENING TOOL Asked and negative response unless otherwise noted:   Have you had symptoms of covid, tested positive or been in contact with someone with symptoms/positive test in the past 5-10 days?

## 2021-01-02 ENCOUNTER — Telehealth: Payer: Self-pay | Admitting: Primary Care

## 2021-01-02 NOTE — Telephone Encounter (Signed)
Ret'd call to patient's wife Pam, and have scheduled a Palliative f/u visit for 01/06/21 @ 9:15 AM.

## 2021-01-04 ENCOUNTER — Other Ambulatory Visit: Payer: Medicare Other

## 2021-01-04 ENCOUNTER — Other Ambulatory Visit: Payer: Self-pay

## 2021-01-05 ENCOUNTER — Other Ambulatory Visit: Payer: Medicare Other | Admitting: Primary Care

## 2021-01-05 ENCOUNTER — Other Ambulatory Visit: Payer: Self-pay

## 2021-01-05 VITALS — BP 142/80 | HR 68 | Temp 98.4°F | Resp 18 | Ht 70.0 in | Wt 160.0 lb

## 2021-01-05 DIAGNOSIS — N390 Urinary tract infection, site not specified: Secondary | ICD-10-CM

## 2021-01-05 DIAGNOSIS — F0390 Unspecified dementia without behavioral disturbance: Secondary | ICD-10-CM

## 2021-01-05 DIAGNOSIS — R531 Weakness: Secondary | ICD-10-CM

## 2021-01-05 NOTE — Progress Notes (Addendum)
[facility] Consult Note Telephone: [phone]  Fax: [phone]   Due to the COVID-19 crisis, this visit was done via telemedicine from my office and it was initiated and consent by this patient and or family.  I connected with  Randall Schneider OR PROXY on 01/05/21 by a video enabled telemedicine application and verified that I am speaking with the correct person using two identifiers.   I discussed the limitations of evaluation and management by telemedicine. The patient expressed understanding and agreed to proceed.  Date of encounter: 01/05/21 11:29 AM PATIENT NAME: Randall Schneider [place] [address]   [phone] (home)  DOB: 05/21/1930 MRN: [id number] PRIMARY CARE PROVIDER:    Sofie Hartigan, MD,  [place] [address] [phone]  REFERRING PROVIDER:   Sofie Hartigan, MD [address],  [place] [address] [phone]  RESPONSIBLE PARTY:    Contact Information     Name Relation Home Work Mobile   [facility] M Spouse [phone]  [phone]   Bonney Leitz   [phone]                                          ASSESSMENT AND PLAN / RECOMMENDATIONS:   Advance Care Planning/Goals of Care: Goals include to maximize quality of life and symptom management.  CODE STATUS: DNR  Symptom Management/Plan:  Urinary Retention: Wife endorses patient is voiding.  She noted brief was wet around 8 am and she completed incontinence care.   Explained in & out cath to patient and wife.  Some resistance noted with insertion of 14 F cath.  500 cc dark yellow urine drained.   U/A and culture collected.  Wife endorses liquid intake has been poor.  Maybe 12-16 ounces of liquids are consumed in 24 hours.  Has had 4 UTIs since April, 2022. Hallucinations  were thought to be associated with UTIs, which wife endorses most recent occurrence with hallucinations was 2 days ago.  Education provided on urinary retention and ways to aide in bladder emptying.   Due to patient's cognitive state, she did feel patient would be able to follow directions to assist with emptying bladder.   Discussion with Randall Schneider, urology PA who recommends indwelling foley catheter. I will also send macrodantin 100 mg bid po for 7 days, empiric treatment. U/a and c/s sent.  Constipation:  Wife endorses last bowel movement was 5 days ago.  She has given dulcolax 5 mg x 2 yesterday without results.  Rectal check completed on no stool present in rectal vault.  Dulcolax 10 mg suppository administered to patient.   Immobility: Patient is entirely bedbound with log roll turns. He is unable to move arms and legs.  Follow up Palliative Care Visit: Palliative care will continue to follow for complex medical decision making, advance care planning, and clarification of goals. Return 4 weeks or prn.  I spent 60 minutes providing this consultation. More than 50% of the time in this consultation was spent in counseling and care coordination.  PPS: 30%  HOSPICE ELIGIBILITY/DIAGNOSIS: TBD  Chief Complaint: urinary retention  HISTORY OF PRESENT ILLNESS:  Randall Schneider is a 87 y.o. year old male  with advanced dementia, urinary retention and chronic uti, immobility, abnormal weight loss .   History obtained from review of EMR, discussion with  primary team, and interview with family, facility staff/caregiver and/or Mr. Randall Schneider.  I reviewed available labs, medications, imaging, studies and related documents from the EMR.  Records reviewed and summarized above.   ROS   General: NAD EYES: denies vision changes ENMT: denies dysphagia Cardiovascular: denies chest pain, denies DOE Pulmonary:+ cough, denies increased SOB Abdomen: endorses poor to fair appetite, +constipation, + incontinence of bowel GU: denies dysuria, + urinary incontinence MSK:  + weakness,  no falls reported Skin: denies rashes or  wounds Neurological: denies pain, denies insomnia Psych: quiet and withdrawn Heme/lymph/immuno: denies bruises, abnormal bleeding  Physical Exam: Constitutional: NAD General: frail appearing, cachectic, Body mass index is 22.96 kg/m. Estimated, 11 % loss in 4 months EYES: anicteric sclera, lids intact, no discharge  ENMT: hearing loss,  oral mucous membranes dry CV: S1S2, RRR, no LE edema Pulmonary: LCTA, no increased work of breathing, no cough, room air Abdomen: intake 50%, normo-active BS + 4 quadrants, soft and non tender, no ascites GU: deferred MSK: no sarcopenia, bed-bound and limited upper and lower extremity movement, left elbow with pocketing edema present. Skin: warm and dry, no rashes or wounds on visible skin Neuro:  + generalized weakness,  + cognitive impairment Psych: non-anxious affect, A and O x 1 Hem/lymph/immuno: no widespread bruising  Outpatient Encounter Medications as of 01/05/2021  Medication Sig   nitrofurantoin (MACRODANTIN) 100 MG capsule Take 100 mg by mouth 2 (two) times daily.   acetaminophen (TYLENOL) 650 MG CR tablet Take 650 mg by mouth every 8 (eight) hours.   aspirin 81 MG tablet Take 81 mg by mouth daily.   atorvastatin (LIPITOR) 10 MG tablet Take 10 mg by mouth daily.   clotrimazole (LOTRIMIN) 1 % cream Apply 1 application topically 2 (two) times daily. To glans of penis (Patient not taking: Reported on 11/10/2020)   donepezil (ARICEPT) 10 MG tablet Take 10 mg by mouth daily.   doxazosin (CARDURA) 2 MG tablet Take 2 mg by mouth at bedtime.   FLUoxetine (PROZAC) 10 MG tablet Take 10 mg by mouth daily.   ibuprofen (ADVIL) 200 MG tablet Take 200 mg by mouth 2 (two) times daily as needed for moderate pain.   Polyethyl Glycol-Propyl Glycol (SYSTANE) 0.4-0.3 % SOLN Apply 1 drop to eye in the morning, at noon, in the evening, and at bedtime. To both eyes   QUEtiapine (SEROQUEL) 25 MG tablet Take 25 mg by mouth at bedtime.   traMADol (ULTRAM) 50 MG  tablet Take 50 mg by mouth 4 (four) times daily as needed for moderate pain.   No facility-administered encounter medications on file as of 01/05/2021.     Thank you for the opportunity to participate in the care of Mr. Silvera.  The palliative care team will continue to follow. Please call our office at [phone] if we can be of additional assistance.   Lorenza Burton, RN  Cyndia Skeeters NP  COVID-19 PATIENT SCREENING TOOL Asked and negative response unless otherwise noted:   Have you had symptoms of covid, tested positive or been in contact with someone with symptoms/positive test in the past 5-10 days?  No

## 2021-01-06 ENCOUNTER — Other Ambulatory Visit: Payer: Medicare Other | Admitting: Primary Care

## 2021-01-06 ENCOUNTER — Other Ambulatory Visit: Payer: Self-pay

## 2021-01-06 DIAGNOSIS — F0390 Unspecified dementia without behavioral disturbance: Secondary | ICD-10-CM

## 2021-01-06 DIAGNOSIS — Z515 Encounter for palliative care: Secondary | ICD-10-CM

## 2021-01-06 NOTE — Progress Notes (Signed)
[facility] Consult Note Telephone: [phone]  Fax: [phone]    Date of encounter: 01/06/21 9:20 AM PATIENT NAME: Randall Schneider [place] [address]   [phone] (home)  DOB: December 01, 1930 MRN: [id number] PRIMARY CARE PROVIDER:    Sofie Hartigan, MD,  [place] [address] [phone]  REFERRING PROVIDER:   Sofie Hartigan, MD [place],  [place] [address] [phone]  RESPONSIBLE PARTY:    Contact Information     Name Relation Home Work Mobile   [facility] Jerilynn Mages Spouse [phone]  [phone]   Bonney Leitz   [phone]        I met face to face with patient and family in  home. Palliative Care was asked to follow this patient by consultation request of  Feldpausch, Chrissie Noa, MD to address advance care planning and complex medical decision making. This is a follow up visit.                                   ASSESSMENT AND PLAN / RECOMMENDATIONS:   Advance Care Planning/Goals of Care: Goals include to maximize quality of life and symptom management. Our advance care planning conversation included a discussion about:     Exploration of personal, cultural or spiritual beliefs that might influence medical decisions  Exploration of goals of care in the event of a sudden injury or illness  Review of an  advance directive document . Decision  to  de-escalate disease focused treatments due to poor prognosis. CODE STATUS: DNR Discussed benefits of hospice care, hospice supportive services. Wife concerned RE not having access to hospital. Assured that was still her choice but that hospice nurses may be able to provide care to avoid hospitalizations. She is amenable and chooses [facility].   I reviewed  a MOST form today. The patient and family outlined their wishes for the following treatment decisions:  Cardiopulmonary Resuscitation: Do Not Attempt  Resuscitation (DNR/No CPR)  Medical Interventions: Comfort Measures: Keep clean, warm, and dry. Use medication by any route, positioning, wound care, and other measures to relieve pain and suffering. Use oxygen, suction and manual treatment of airway obstruction as needed for comfort. Do not transfer to the hospital unless comfort needs cannot be met in current location.  Antibiotics: Determine use of limitation of antibiotics when infection occurs  IV Fluids: IV fluids for a defined trial period  Feeding Tube: No feeding tube    I spent 20 minutes providing this consultation. More than 50% of the time in this consultation was spent in counseling and care coordination. _________________________________________________________________________________________________________________________  Symptom Management/Plan:  Nutrition: Eating 2 good meals per wife but overall calorie malnutrition. Pt has wasting of muscles and temporal wasting.   Rigidity: Pt has rigidity with some cogwheel movements. Neurology ok'ed trial of sinemet 25-100 mg tid x 1 month to see if this improves bed  mobility. Sent to [facility] with refills. Wife instructed to use if pt has positive response, and if not to d/c.  Urine Retention: Discussed chronic retention. U/a not back from lab yet, but wife states he seems to be voiding ok today. I will leave coude and kit in home for use later by hospice nurses, if deemed needed. Wife reports I and o cath yesterday was difficult and so I recommend foley to be left in, per  urology consultation yesterday. Wife picked up macrodantin 100 mg bid I sent yesterday.  Mood: Improved with 25 mg seroquel at hs. Has not needed to escalate dose as discussed in last visit.  Follow up Palliative Care Visit: Admit to hospice services.  This visit was coded based on medical decision making (MDM).  PPS: 30%  HOSPICE ELIGIBILITY/DIAGNOSIS: yes/protein malnutrition  Chief Complaint:  malnutrition, debility  HISTORY OF PRESENT ILLNESS:  Randall Schneider is a 87 y.o. year old male  with advanced dementia, immobility, chronic uti, anorexia and wt loss. Has become bedbound now over 6 months, and is 100% dependent in adls. He is painful when moved and unable to tolerate ROM, functional quadraplegia. Family would like hospice services for supportive care of advanced illness .   History obtained from review of EMR, discussion with primary team, and interview with family, facility staff/caregiver and/or Randall Schneider.  I reviewed available labs, medications, imaging, studies and related documents from the EMR.  Records reviewed and summarized above.   ROS   General: NAD ENMT: denies dysphagia Cardiovascular: denies chest pain, denies DOE Pulmonary: denies cough, denies increased SOB Abdomen: endorses fair appetite, denies constipation, endorses incontinence of bowel GU: endorses dysuria, endorses incontinence of urine, endorses retention at times. MSK:  endorses  weakness,  no falls reported Skin: denies rashes or wounds Neurological: endorses pain when moved, endorses occ insomnia but better Psych: Endorses agitated at times mood, this improved on seroquel Heme/lymph/immuno: denies bruises, abnormal bleeding  Physical Exam: Current and past weights:unavailable, subjective cachexia Constitutional: NAD General: frail appearing, thin EYES: anicteric sclera, lids intact, no discharge  ENMT: intact hearing, oral mucous membranes dry, edentulous CV: S1S2, RRR, no LE edema Pulmonary: LCTA, no increased work of breathing, no cough, room air Abdomen: intake 25%, normo-active BS + 4 quadrants, soft and non tender, no ascites GU: deferred MSK: severe  sarcopenia, rigidity all extremities, non  ambulatory Skin: warm and dry, no rashes or wounds on visible skin Neuro:  severe  generalized weakness,  severe cognitive impairment Psych: non-anxious affect, A and O x 1 Hem/lymph/immuno: no  widespread bruising   Thank you for the opportunity to participate in the care of Randall Schneider.  The palliative care team will continue to follow. Please call our office at [phone] if we can be of additional assistance.   Jason Coop, NP DNP, AGPCNP-BC  COVID-19 PATIENT SCREENING TOOL Asked and negative response unless otherwise noted:   Have you had symptoms of covid, tested positive or been in contact with someone with symptoms/positive test in the past 5-10 days?

## 2021-04-24 ENCOUNTER — Other Ambulatory Visit: Payer: Self-pay

## 2021-04-24 ENCOUNTER — Emergency Department
Admission: EM | Admit: 2021-04-24 | Discharge: 2021-04-24 | Disposition: A | Discharge status: Home / Self Care | Payer: Medicare Other | Attending: Emergency Medicine | Admitting: Emergency Medicine

## 2021-04-24 DIAGNOSIS — Z20822 Contact with and (suspected) exposure to covid-19: Secondary | ICD-10-CM | POA: Diagnosis not present

## 2021-04-24 DIAGNOSIS — R531 Weakness: Secondary | ICD-10-CM | POA: Insufficient documentation

## 2021-04-24 DIAGNOSIS — R1310 Dysphagia, unspecified: Secondary | ICD-10-CM | POA: Diagnosis not present

## 2021-04-24 DIAGNOSIS — F0390 Unspecified dementia without behavioral disturbance: Secondary | ICD-10-CM | POA: Insufficient documentation

## 2021-04-24 DIAGNOSIS — Y844 Aspiration of fluid as the cause of abnormal reaction of the patient, or of later complication, without mention of misadventure at the time of the procedure: Secondary | ICD-10-CM | POA: Insufficient documentation

## 2021-04-24 DIAGNOSIS — Z9189 Other specified personal risk factors, not elsewhere classified: Secondary | ICD-10-CM

## 2021-04-24 LAB — COMPREHENSIVE METABOLIC PANEL
ALT: 9 U/L (ref 0–44)
AST: 16 U/L (ref 15–41)
Albumin: 2.5 g/dL — ABNORMAL LOW (ref 3.5–5.0)
Alkaline Phosphatase: 101 U/L (ref 38–126)
Anion gap: 4 — ABNORMAL LOW (ref 5–15)
BUN: 19 mg/dL (ref 8–23)
CO2: 26 mmol/L (ref 22–32)
Calcium: 8.7 mg/dL — ABNORMAL LOW (ref 8.9–10.3)
Chloride: 105 mmol/L (ref 98–111)
Creatinine, Ser: 0.58 mg/dL — ABNORMAL LOW (ref 0.61–1.24)
GFR, Estimated: >60 mL/min (ref >60)
Glucose, Bld: 100 mg/dL — ABNORMAL HIGH (ref 70–99)
Potassium: 3.6 mmol/L (ref 3.5–5.1)
Sodium: 135 mmol/L (ref 135–145)
Total Bilirubin: 0.6 mg/dL (ref 0.3–1.2)
Total Protein: 6.2 g/dL — ABNORMAL LOW (ref 6.5–8.1)

## 2021-04-24 LAB — CBC WITH DIFFERENTIAL/PLATELET
Abs Immature Granulocytes: 0.02 10*3/uL (ref 0.00–0.07)
Basophils Absolute: 0.1 10*3/uL (ref 0.0–0.1)
Basophils Relative: 1 %
Eosinophils Absolute: 0.3 10*3/uL (ref 0.0–0.5)
Eosinophils Relative: 5 %
HCT: 37.2 % — ABNORMAL LOW (ref 39.0–52.0)
Hemoglobin: 11.5 g/dL — ABNORMAL LOW (ref 13.0–17.0)
Immature Granulocytes: 0 %
Lymphocytes Relative: 21 %
Lymphs Abs: 1.2 10*3/uL (ref 0.7–4.0)
MCH: 28 pg (ref 26.0–34.0)
MCHC: 30.9 g/dL (ref 30.0–36.0)
MCV: 90.7 fL (ref 80.0–100.0)
Monocytes Absolute: 0.4 10*3/uL (ref 0.1–1.0)
Monocytes Relative: 7 %
Neutro Abs: 4.1 10*3/uL (ref 1.7–7.7)
Neutrophils Relative %: 66 %
Platelets: 230 10*3/uL (ref 150–400)
RBC: 4.1 MIL/uL — ABNORMAL LOW (ref 4.22–5.81)
RDW: 14.7 % (ref 11.5–15.5)
WBC: 6.1 10*3/uL (ref 4.0–10.5)
nRBC: 0 % (ref 0.0–0.2)

## 2021-04-24 LAB — RESP PANEL BY RT-PCR (FLU A&B, COVID) ARPGX2
Influenza A by PCR: NEGATIVE
Influenza B by PCR: NEGATIVE
SARS Coronavirus 2 by RT PCR: NEGATIVE

## 2021-04-24 LAB — MAGNESIUM: Magnesium: 2.1 mg/dL (ref 1.7–2.4)

## 2021-04-24 LAB — TROPONIN I (HIGH SENSITIVITY): Troponin I (High Sensitivity): 9 ng/L (ref <18)

## 2021-04-24 NOTE — ED Triage Notes (Signed)
Pt BIB EMS from home for increasing lethargy. Family told EMS pt has not been eating, unable to keep anything down, vomiting, leading to choking x1 wk.

## 2021-04-24 NOTE — ED Notes (Signed)
This RN fed pt apple sauce. Pt able to swallow apple sauce without any difficulties.

## 2021-04-24 NOTE — ED Notes (Signed)
Pt resting comfortably in bed, NAD. No needs verbalized/identified at this time. Bed low & locked; call light within reach.

## 2021-04-24 NOTE — ED Provider Notes (Signed)
[facility] Provider Note    Event Date/Time   First MD Initiated Contact with Patient 04/24/21 1232     (approximate)   History   Weakness   HPI  Randall Schneider is a 88 y.o. male with dementia and hearing loss who comes in with wife who is the primary caretaker wife for concern for not eating as much.  Wife is at bedside.  She reports being concerned that he has not been able to eat as well recently due to some choking incidents.  He is able to keep some stuff down but will occasionally start to sound like a "clogged toilet".  She reports not being sure if this was something that could be reversed like as if there could be something stuck in his throat that need to be taken out.  She denies any abdominal pain or any other concerns.  She does report that he is on hospice   Physical Exam   Triage Vital Signs: Blood pressure 132/76, pulse 80, temperature 98.1 F (36.7 C), temperature source Axillary, resp. rate 17, height 5\' 10"  (1.778 m), weight 68.2 kg, SpO2 98 %.   Most recent vital signs: Vitals:   04/24/21 1400 04/24/21 1430  BP: 134/78 132/76  Pulse: 72 80  Resp: 15 17  Temp:    SpO2: 98% 98%     General: Awake, no distress. Elderly male  CV:  Good peripheral perfusion.  Resp:  Normal effort.  Abd:  No distention. Soft and non tender  Other:  Able to state his name.   ED Results / Procedures / Treatments   Labs (all labs ordered are listed, but only abnormal results are displayed) Labs Reviewed  CBC WITH DIFFERENTIAL/PLATELET - Abnormal; Notable for the following components:      Result Value   RBC 4.10 (*)    Hemoglobin 11.5 (*)    HCT 37.2 (*)    All other components within normal limits  COMPREHENSIVE METABOLIC PANEL - Abnormal; Notable for the following components:   Glucose, Bld 100 (*)    Creatinine, Ser 0.58 (*)    Calcium 8.7 (*)    Total Protein 6.2 (*)    Albumin 2.5 (*)    Anion gap 4 (*)    All other components  within normal limits  RESP PANEL BY RT-PCR (FLU A&B, COVID) ARPGX2  MAGNESIUM  URINALYSIS, ROUTINE W REFLEX MICROSCOPIC  TROPONIN I (HIGH SENSITIVITY)  TROPONIN I (HIGH SENSITIVITY)     EKG  My interpretation of EKG:  Normal sinus rate of 63 without any ST elevation or T wave inversions, normal intervals    PROCEDURES:  Critical Care performed: No  .1-3 Lead EKG Interpretation Performed by: Vanessa Wauseon, MD Authorized by: Vanessa [place], MD     Interpretation: normal     ECG rate:  80   ECG rate assessment: normal     Rhythm: sinus rhythm     Ectopy: none     Conduction: normal     MEDICATIONS ORDERED IN ED: Medications - No data to display   IMPRESSION / MDM / [place] / ED COURSE  I reviewed the triage vital signs and the nursing notes.                               Patient with known dementia, Parkinson's on hospice who comes in with difficulty swallowing.  Had a very  extensive conversation with patient's wife about goals of care.  She states that she is just not ready for him to pass and that when she noted that he was starting to choke on food she thought maybe there could just be some food stuck in there that could just be removed.  Had a lengthy conversation with patient that if there was a food bolus that he would not be able to tolerate any food and they would not do endoscopy unless there was very high suspicion for obstruction.  Patient was able to tolerate applesauce here without any issues therefore I have very low suspicion of obstruction.  I did discuss that doing a thickened diet would help decrease the chance of aspiration but unfortunate is a process with dementia/Parkinson's is that they have difficulties with swallowing.  We discussed other etiologies such as stroke but they have declined any imaging of the head given unlikely to change our management.  We also discussed G-tube but they stated that he would not have wanted that.  Therefore we  discussed trying to help decrease aspiration risk by doing a thickened diet.  His abdomen is soft and nontender I have low suspicion for acute abdominal process.  Patient remains on hospice and they feel comfortable with this plan of thickened diet they just want to make sure there was nothing else that could be easily reversible.  Magnesium normal CBC shows stable hemoglobin Kidney function normal Cardiac marker negative unlikely ACS      The patient is on the cardiac monitor to evaluate for evidence of arrhythmia and/or significant heart rate changes.   FINAL CLINICAL IMPRESSION(S) / ED DIAGNOSES   Final diagnoses:  Weakness  Dysphagia, unspecified type  At high risk for aspiration     Rx / DC Orders   ED Discharge Orders     None        Note:  This document was prepared using Dragon voice recognition software and may include unintentional dictation errors.   Vanessa Oasis, MD 04/24/21 1515

## 2021-04-24 NOTE — ED Notes (Signed)
Pt calling out. This RN went & rounded on pt. Pt has no needs at this time. Bed low & locked; call light within reach.

## 2021-04-24 NOTE — Discharge Instructions (Addendum)
Discussed with your hospice team about potentially doing a thickened liquid diet.  This could help prevent aspiration.  At this time his oxygen levels look good blood work looks good but he could return to the ER if things are changing

## 2021-04-24 NOTE — ED Notes (Signed)
This RN called & spoke with lab re: blood sent & orders just now placed.

## 2021-04-24 NOTE — ED Notes (Signed)
Lavender, blue, light green, gray on ice tubes sent to lab.

## 2021-04-24 NOTE — ED Notes (Signed)
8:12 pm Called [facility] for update..Still on the list/will come when truck is available

## 2021-04-24 NOTE — ED Notes (Signed)
COVID swab sent to lab.

## 2021-04-24 NOTE — ED Notes (Signed)
Called [facility] for transport to [place]

## 2021-06-10 DEATH — deceased

## 2022-04-20 IMAGING — CT CT CERVICAL SPINE W/O CM
3 of 4 series · 12 of 33 positions shown, 14 images · non-contrast
Comparison: May 27, 2020

CLINICAL DATA: Altered mental status

EXAM:
CT HEAD WITHOUT CONTRAST
TECHNIQUE: Contiguous axial images were obtained from the base of the skull
through the vertex without intravenous contrast.

[Series 4: sagittal bone · sagittal · 0.26mm/px · 5 of 57 slices shown, 6 images]
[im 19/57  bone]
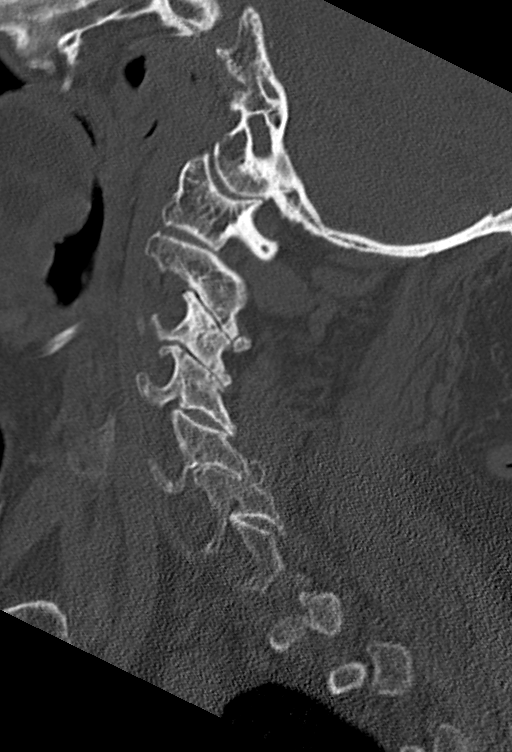
[im 24/57  bone]
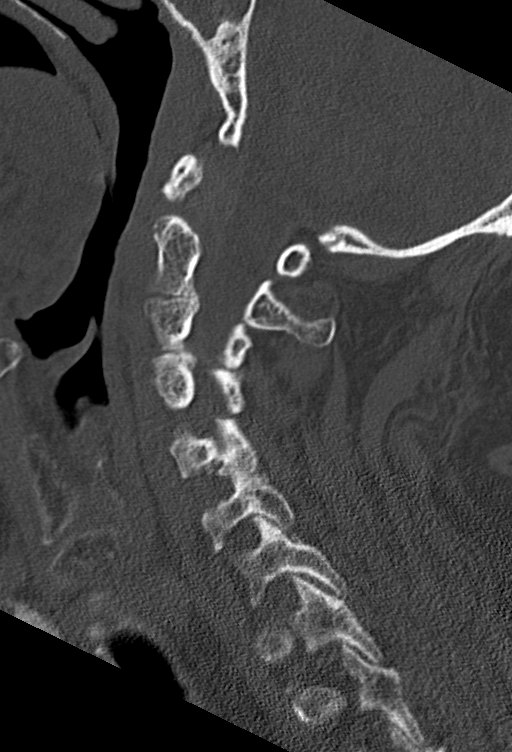
[im 29/57  soft-tissue]
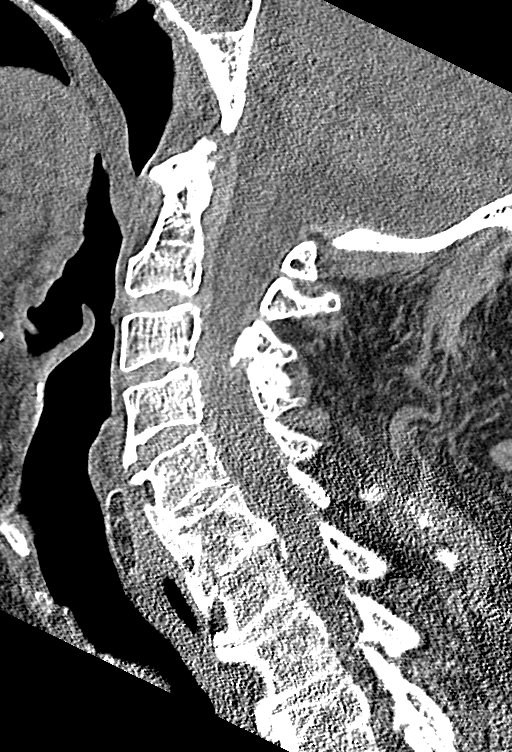
[im 29/57  bone]
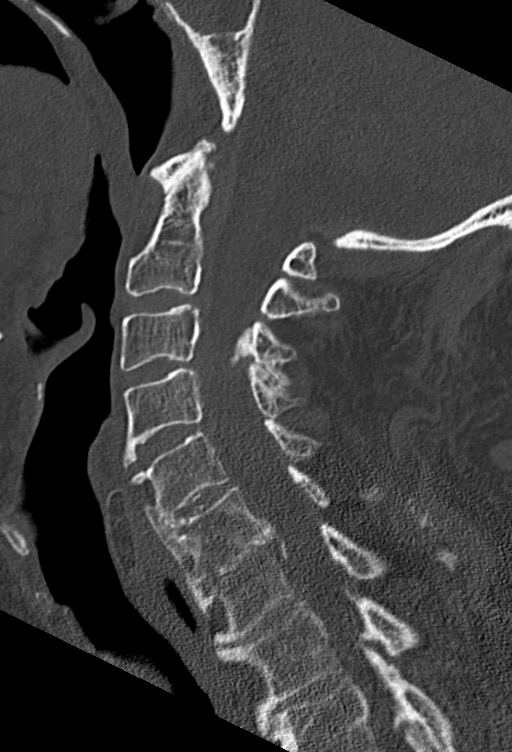
[im 33/57  bone]
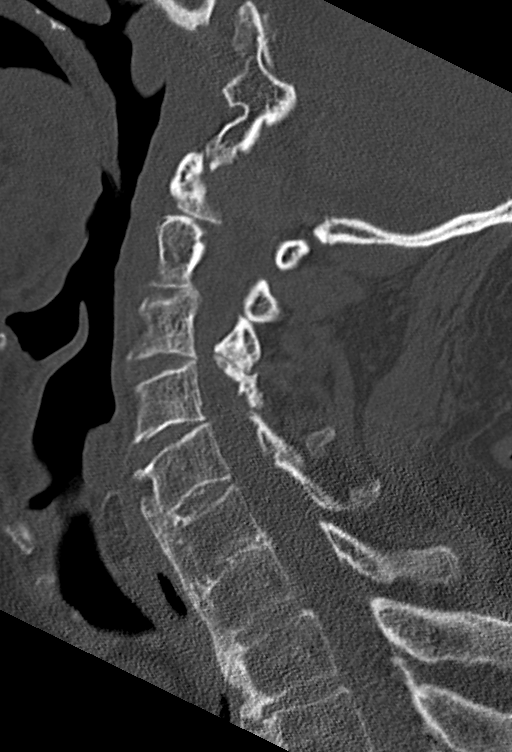
[im 38/57  bone]
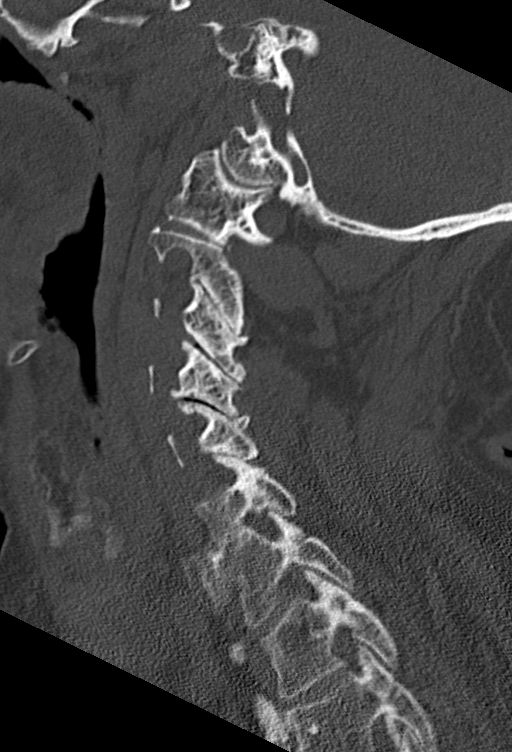

[Series 5: coronal bone · coronal · 0.26mm/px · 3 of 54 slices shown]
[im 14/54  bone]
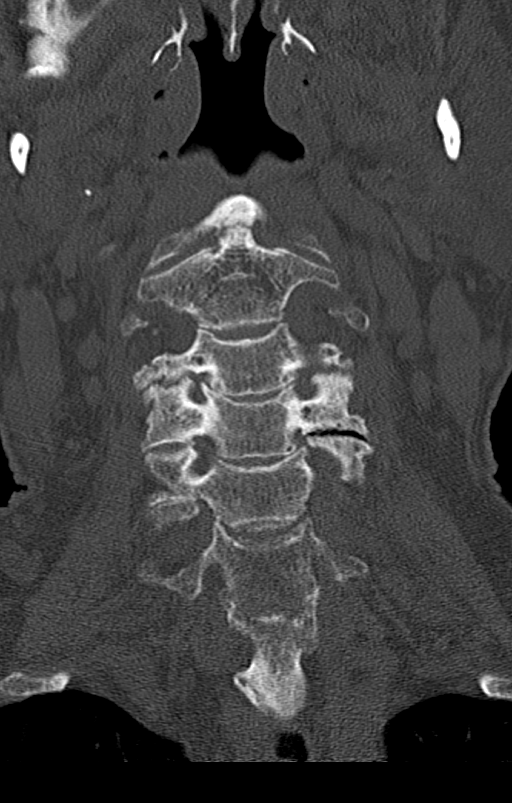
[im 23/54  bone]
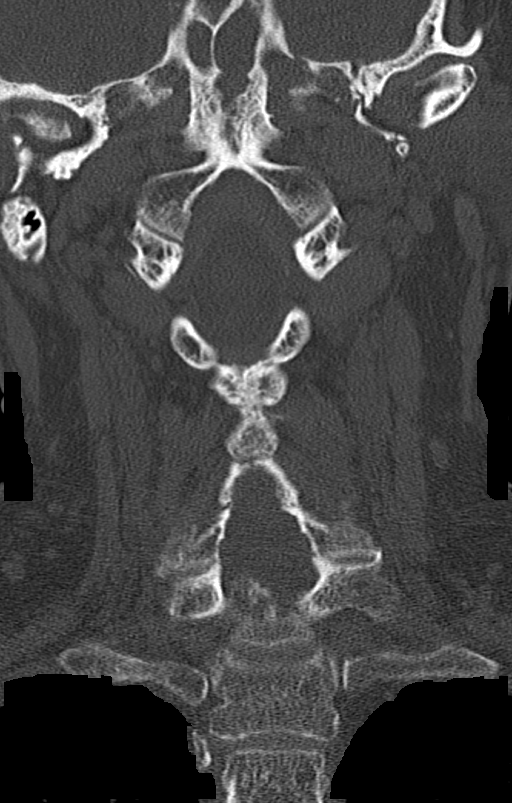
[im 32/54  bone]
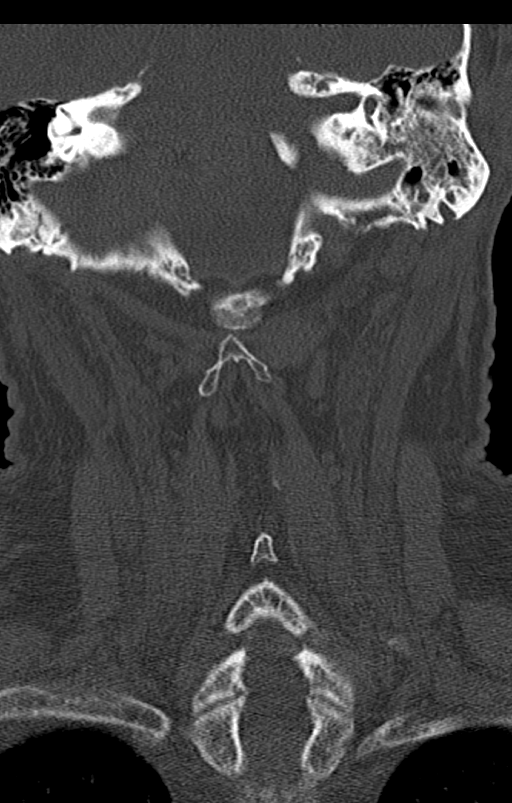

[Series 6: orthogonal bone · axial · 0.23mm/px · z∈[-279,-178]mm · 4 of 90 slices shown, 5 images]
[im 15/90  soft-tissue]
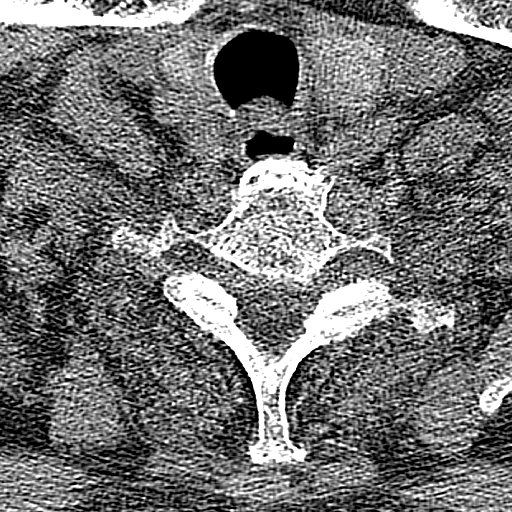
[im 15/90  bone]
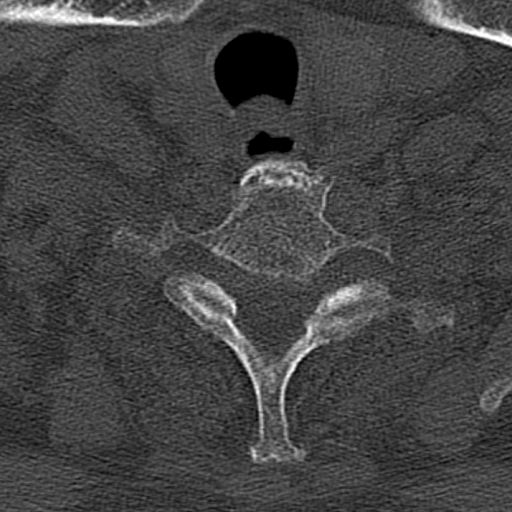
[im 30/90  bone]
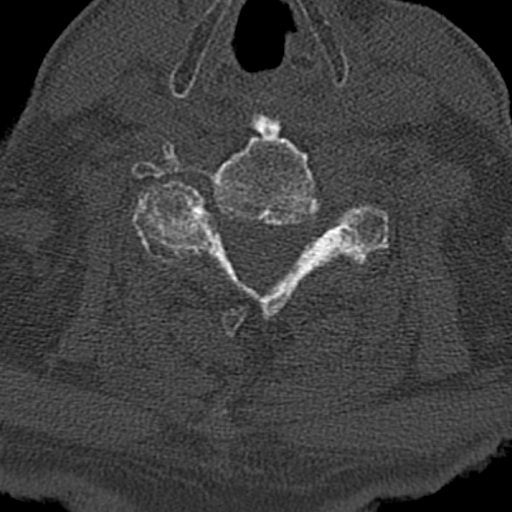
[im 60/90  bone]
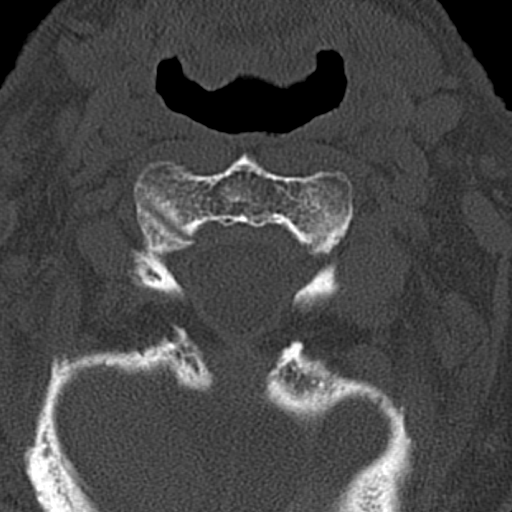
[im 75/90  bone]
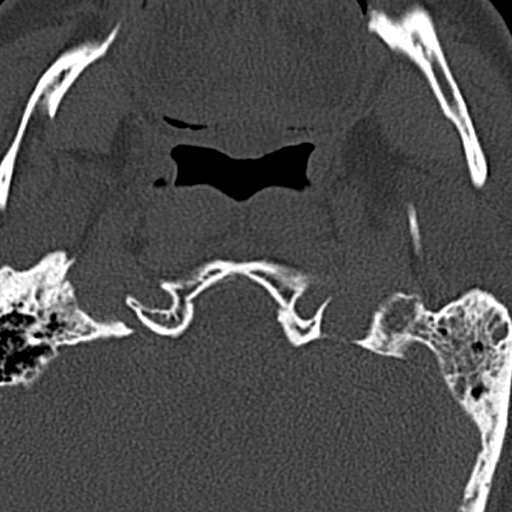

[12 of 33 positions shown; findings below may reference images not displayed]

FINDINGS: Brain: No evidence of acute territorial infarction, hemorrhage,
hydrocephalus,extra-axial collection or mass lesion/mass effect.
There is dilatation the ventricles and sulci consistent with
age-related atrophy. Low-attenuation changes in the deep white
matter consistent with small vessel ischemia.

Vascular: No hyperdense vessel or unexpected calcification.

Skull: The skull is intact. No fracture or focal lesion identified.

Sinuses/Orbits: There is complete opacification of the bilateral
maxillary sinuses, ethmoid air cells, sphenoid sinuses and frontal
sinuses. Again noted is partial opacification of the left mastoid
air cells

Other: None
IMPRESSION: No acute intracranial abnormality.

Findings consistent with age related atrophy and chronic small
vessel ischemia

Chronic pan sinusitis
